# Patient Record
Sex: Female | Born: 1955 | Hispanic: Refuse to answer | Marital: Married | Smoking: Never smoker
Health system: Southern US, Community
[De-identification: ages and names within clinical notes are randomized; demographics above are authoritative.]

## PROBLEM LIST (undated history)

## (undated) DIAGNOSIS — F329 Major depressive disorder, single episode, unspecified: Secondary | ICD-10-CM

## (undated) DIAGNOSIS — I341 Nonrheumatic mitral (valve) prolapse: Secondary | ICD-10-CM

## (undated) DIAGNOSIS — Z87898 Personal history of other specified conditions: Secondary | ICD-10-CM

## (undated) DIAGNOSIS — T7840XA Allergy, unspecified, initial encounter: Secondary | ICD-10-CM

## (undated) DIAGNOSIS — M329 Systemic lupus erythematosus, unspecified: Secondary | ICD-10-CM

## (undated) DIAGNOSIS — D649 Anemia, unspecified: Secondary | ICD-10-CM

## (undated) DIAGNOSIS — F32A Depression, unspecified: Secondary | ICD-10-CM

## (undated) DIAGNOSIS — F419 Anxiety disorder, unspecified: Secondary | ICD-10-CM

## (undated) DIAGNOSIS — D219 Benign neoplasm of connective and other soft tissue, unspecified: Secondary | ICD-10-CM

## (undated) DIAGNOSIS — IMO0002 Reserved for concepts with insufficient information to code with codable children: Secondary | ICD-10-CM

## (undated) DIAGNOSIS — R002 Palpitations: Secondary | ICD-10-CM

## (undated) HISTORY — DX: Anemia, unspecified: D64.9

## (undated) HISTORY — PX: TRANSTHORACIC ECHOCARDIOGRAM: SHX275

## (undated) HISTORY — PX: WISDOM TOOTH EXTRACTION: SHX21

## (undated) HISTORY — DX: Allergy, unspecified, initial encounter: T78.40XA

## (undated) HISTORY — DX: Systemic lupus erythematosus, unspecified: M32.9

## (undated) HISTORY — DX: Anxiety disorder, unspecified: F41.9

## (undated) HISTORY — DX: Benign neoplasm of connective and other soft tissue, unspecified: D21.9

## (undated) HISTORY — PX: TOTAL ABDOMINAL HYSTERECTOMY: SHX209

## (undated) HISTORY — DX: Reserved for concepts with insufficient information to code with codable children: IMO0002

## (undated) HISTORY — PX: TONSILLECTOMY AND ADENOIDECTOMY: SUR1326

## (undated) HISTORY — DX: Palpitations: R00.2

## (undated) HISTORY — PX: MYOMECTOMY: SHX85

## (undated) HISTORY — DX: Nonrheumatic mitral (valve) prolapse: I34.1

## (undated) HISTORY — DX: Depression, unspecified: F32.A

## (undated) HISTORY — DX: Personal history of other specified conditions: Z87.898

## (undated) HISTORY — DX: Major depressive disorder, single episode, unspecified: F32.9

---

## 1998-10-11 ENCOUNTER — Encounter (INDEPENDENT_AMBULATORY_CARE_PROVIDER_SITE_OTHER): Payer: Self-pay | Admitting: Specialist

## 1998-10-11 ENCOUNTER — Ambulatory Visit (HOSPITAL_COMMUNITY): Admission: RE | Admit: 1998-10-11 | Discharge: 1998-10-11 | Payer: Self-pay | Admitting: *Deleted

## 1999-01-30 ENCOUNTER — Emergency Department (HOSPITAL_COMMUNITY): Admission: EM | Admit: 1999-01-30 | Discharge: 1999-01-30 | Payer: Self-pay | Admitting: Internal Medicine

## 1999-07-06 ENCOUNTER — Ambulatory Visit (HOSPITAL_COMMUNITY): Admission: RE | Admit: 1999-07-06 | Discharge: 1999-07-06 | Payer: Self-pay | Admitting: Surgery

## 1999-07-06 ENCOUNTER — Encounter (INDEPENDENT_AMBULATORY_CARE_PROVIDER_SITE_OTHER): Payer: Self-pay | Admitting: Specialist

## 1999-09-21 ENCOUNTER — Encounter: Payer: Self-pay | Admitting: Internal Medicine

## 1999-09-21 ENCOUNTER — Encounter: Admission: RE | Admit: 1999-09-21 | Discharge: 1999-09-21 | Payer: Self-pay | Admitting: Internal Medicine

## 1999-11-17 ENCOUNTER — Other Ambulatory Visit: Admission: RE | Admit: 1999-11-17 | Discharge: 1999-11-17 | Payer: Self-pay | Admitting: *Deleted

## 2000-09-21 ENCOUNTER — Encounter: Admission: RE | Admit: 2000-09-21 | Discharge: 2000-09-21 | Payer: Self-pay | Admitting: Internal Medicine

## 2000-09-21 ENCOUNTER — Encounter: Payer: Self-pay | Admitting: Internal Medicine

## 2000-11-30 ENCOUNTER — Other Ambulatory Visit: Admission: RE | Admit: 2000-11-30 | Discharge: 2000-11-30 | Payer: Self-pay | Admitting: *Deleted

## 2001-10-04 ENCOUNTER — Encounter: Payer: Self-pay | Admitting: Internal Medicine

## 2001-10-04 ENCOUNTER — Encounter: Admission: RE | Admit: 2001-10-04 | Discharge: 2001-10-04 | Payer: Self-pay | Admitting: Internal Medicine

## 2002-02-12 ENCOUNTER — Other Ambulatory Visit: Admission: RE | Admit: 2002-02-12 | Discharge: 2002-02-12 | Payer: Self-pay | Admitting: Obstetrics and Gynecology

## 2002-02-25 ENCOUNTER — Encounter: Payer: Self-pay | Admitting: Obstetrics and Gynecology

## 2002-02-25 ENCOUNTER — Ambulatory Visit (HOSPITAL_COMMUNITY): Admission: RE | Admit: 2002-02-25 | Discharge: 2002-02-25 | Payer: Self-pay | Admitting: Obstetrics and Gynecology

## 2002-05-16 ENCOUNTER — Encounter: Payer: Self-pay | Admitting: Obstetrics and Gynecology

## 2002-05-16 ENCOUNTER — Ambulatory Visit (HOSPITAL_COMMUNITY): Admission: RE | Admit: 2002-05-16 | Discharge: 2002-05-16 | Payer: Self-pay | Admitting: Obstetrics and Gynecology

## 2002-09-05 ENCOUNTER — Encounter (INDEPENDENT_AMBULATORY_CARE_PROVIDER_SITE_OTHER): Payer: Self-pay | Admitting: *Deleted

## 2002-09-05 ENCOUNTER — Inpatient Hospital Stay (HOSPITAL_COMMUNITY): Admission: RE | Admit: 2002-09-05 | Discharge: 2002-09-08 | Payer: Self-pay | Admitting: Obstetrics and Gynecology

## 2002-10-13 ENCOUNTER — Encounter: Payer: Self-pay | Admitting: Internal Medicine

## 2002-10-13 ENCOUNTER — Ambulatory Visit (HOSPITAL_COMMUNITY): Admission: RE | Admit: 2002-10-13 | Discharge: 2002-10-13 | Payer: Self-pay | Admitting: Internal Medicine

## 2003-09-18 ENCOUNTER — Other Ambulatory Visit: Admission: RE | Admit: 2003-09-18 | Discharge: 2003-09-18 | Payer: Self-pay | Admitting: Obstetrics and Gynecology

## 2003-10-29 ENCOUNTER — Ambulatory Visit (HOSPITAL_COMMUNITY): Admission: RE | Admit: 2003-10-29 | Discharge: 2003-10-29 | Payer: Self-pay | Admitting: Internal Medicine

## 2003-11-04 ENCOUNTER — Other Ambulatory Visit: Admission: RE | Admit: 2003-11-04 | Discharge: 2003-11-04 | Payer: Self-pay | Admitting: Obstetrics and Gynecology

## 2004-03-11 ENCOUNTER — Other Ambulatory Visit: Admission: RE | Admit: 2004-03-11 | Discharge: 2004-03-11 | Payer: Self-pay | Admitting: Obstetrics and Gynecology

## 2004-03-12 ENCOUNTER — Emergency Department (HOSPITAL_COMMUNITY): Admission: EM | Admit: 2004-03-12 | Discharge: 2004-03-12 | Payer: Self-pay | Admitting: Emergency Medicine

## 2004-07-25 ENCOUNTER — Encounter (INDEPENDENT_AMBULATORY_CARE_PROVIDER_SITE_OTHER): Payer: Self-pay | Admitting: *Deleted

## 2004-07-25 ENCOUNTER — Ambulatory Visit (HOSPITAL_BASED_OUTPATIENT_CLINIC_OR_DEPARTMENT_OTHER): Admission: RE | Admit: 2004-07-25 | Discharge: 2004-07-25 | Payer: Self-pay | Admitting: Surgery

## 2004-11-09 ENCOUNTER — Ambulatory Visit (HOSPITAL_COMMUNITY): Admission: RE | Admit: 2004-11-09 | Discharge: 2004-11-09 | Payer: Self-pay | Admitting: Obstetrics and Gynecology

## 2004-11-18 ENCOUNTER — Other Ambulatory Visit: Admission: RE | Admit: 2004-11-18 | Discharge: 2004-11-18 | Payer: Self-pay | Admitting: Obstetrics and Gynecology

## 2004-11-24 ENCOUNTER — Ambulatory Visit (HOSPITAL_COMMUNITY): Admission: RE | Admit: 2004-11-24 | Discharge: 2004-11-24 | Payer: Self-pay | Admitting: Obstetrics and Gynecology

## 2005-11-23 ENCOUNTER — Other Ambulatory Visit: Admission: RE | Admit: 2005-11-23 | Discharge: 2005-11-23 | Payer: Self-pay | Admitting: Obstetrics and Gynecology

## 2005-11-29 ENCOUNTER — Ambulatory Visit (HOSPITAL_COMMUNITY): Admission: RE | Admit: 2005-11-29 | Discharge: 2005-11-29 | Payer: Self-pay | Admitting: Obstetrics and Gynecology

## 2006-12-03 ENCOUNTER — Ambulatory Visit (HOSPITAL_COMMUNITY): Admission: RE | Admit: 2006-12-03 | Discharge: 2006-12-03 | Payer: Self-pay | Admitting: Obstetrics and Gynecology

## 2007-12-06 ENCOUNTER — Ambulatory Visit (HOSPITAL_COMMUNITY): Admission: RE | Admit: 2007-12-06 | Discharge: 2007-12-06 | Payer: Self-pay | Admitting: Obstetrics and Gynecology

## 2008-12-07 ENCOUNTER — Ambulatory Visit (HOSPITAL_COMMUNITY): Admission: RE | Admit: 2008-12-07 | Discharge: 2008-12-07 | Payer: Self-pay | Admitting: Obstetrics and Gynecology

## 2009-12-09 ENCOUNTER — Ambulatory Visit (HOSPITAL_COMMUNITY): Admission: RE | Admit: 2009-12-09 | Discharge: 2009-12-09 | Payer: Self-pay | Admitting: Obstetrics and Gynecology

## 2010-08-12 NOTE — H&P (Signed)
NAME:  Sharon Kramer, Sharon Kramer                        ACCOUNT NO.:  [id number]   MEDICAL RECORD NO.:  [id number]                   PATIENT TYPE:  INP   LOCATION:  NA                                   [facility]:  [facility]   PHYSICIAN:  Dois Davenport A. Rivard, M.D.              DATE OF BIRTH:  1955-06-15   DATE OF ADMISSION:  09/05/2002  DATE OF DISCHARGE:                                HISTORY & PHYSICAL   REASON FOR ADMISSION:  Uterine fibroids.   HISTORY OF PRESENT ILLNESS:  This is a 55 year old married African-American  female gravida 2, para 2, live three children who is being admitted to  undergo total abdominal hysterectomy for large uterine fibroids.  The  patient had been followed regularly by her previous gynecologist and has  been known to have uterine fibroids for many years.  She has had a previous  myomectomy in 1987 and multiple ultrasound studies.  She came to me on  April 30, 2002 for a second opinion on treatment options.  She reports for  the last year and a half irregular cycles every 23-60 days, four days of  heavy flow with no metrorrhagia, no severe dysmenorrhea or dyspareunia.  She  denies any postcoital bleeding.  She has had an endometrial biopsy on  March 25, 2002 which was normal as well as multiple imaging studies.   Her most recent pelvic MRI was on May 16, 2002 and revealed a very  large lobulated pelvic mass arising from the posterior uterine myometrium  measuring 15 x 11.4 x 13.6 cm.  The cranial extent is just above the level  of the umbilicus.  This mass displaces the uterus caudally in the pelvis.  Those features are most compatible with a large subserosal fibroid.  A  second lobulated lesion which arises from the right cranial aspect of the  large mass and measures 6.6 x 5.1 cm is also compatible with a large  fibroid.  Several other smaller fibroids are identified throughout the body  of the uterus including a 5.5 cm intramural left fibroid.  Right ovary  is  identified along the right pelvic side wall and is normal.  The left ovary  is positioned inferiorly in the anatomic pelvis and also appears normal.  No  lymphadenopathy is identified.  Endometrial biopsy on March 25, 2002  revealed superficial endometrium with no evidence of malignancy.  Pap smear  in November 2003 was within normal limits.  Last mammogram June 2002 was  normal.   The patient has received Depo-Lupron 3.75 mg on June 05, 2002 as well as  July 03, 2002 and had complained of significant frequent hot flashes as well  as amenorrhea.  She is going to receive her third Depo-Lupron next week.  She had also made arrangements with [facility] for autotransfusion and  will be giving 1 unit on May 13 as well as 1 unit on May  20 in preparation  for surgery.  She otherwise denies any pelvic pain.   REVIEW OF SYSTEMS:  CONSTITUTIONAL:  Normal.  HEENT:  Negative.  CARDIOVASCULAR:  Normal.  GASTROINTESTINAL:  Normal.  URINARY:  Normal.  PSYCHIATRIC:  Normal.   PAST MEDICAL HISTORY:  1. Spontaneous vaginal delivery in 1980 of a female and 53 of twin males.  2. Status post tonsillectomy.  3. Status post myomectomy 1987.  4. Hypertension.  5. Mitral valve prolapse requiring premedication.  6. Anxiety.  7. Previous history of anemia with a decrease in hemoglobin at 9.7.     Currently, hemoglobin at 12.1.  8. Recent colonoscopy in April 2004 by Tasia Catchings, M.D. within normal     limits.   ALLERGIES:  PENICILLIN.   CURRENT MEDICATIONS:  1. Atenolol 50 mg p.o. daily.  2. Zoloft 50 mg p.o. daily.   SOCIAL HISTORY:  Married.  Nonsmoker.  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   FAMILY HISTORY:  Father deceased at age 35 of colon cancer.  Mother is 64  years old.  Has glaucoma and thyroid issues.  Sister is 47 year old, alive  and well.  No history of feminine cancer.   PHYSICAL EXAMINATION:  VITAL SIGNS:  Current weight 142 pounds, height 5   feet 5 inches, blood pressure 140/80.  HEENT:  Normal.  NECK:  Thyroid not enlarged.  HEART:  Mild systolic murmur 1-2/6.  CHEST:  Clear.  BREASTS:  Normal.  BACK:  No CVA tenderness.  ABDOMEN:  No hepatosplenomegaly.  Uterus palpated 2 cm below the umbilicus  approximately at 18 weeks size, nonmobile, filling the entire pelvic cavity.  EXTREMITIES:  Negative.  NEUROLOGIC:  Normal.  PELVIC:  Normal external genitalia.  Normal vagina.  Normal cervix.  Uterus,  again, filling the entire pelvic cavity, immobile, nontender, approximately  18 weeks in size.  Adnexa not felt.   ASSESSMENT:  Large uterine fibroids in asymptomatic perimenopausal patient  who desires surgical treatment.   Total abdominal hysterectomy is scheduled for September 05, 2002.  Procedure has  been fully reviewed with the patient including possible complications such  as bleeding, infection, injury to bowel, bladder, or ureters, need for  midline incision, hospital stay and recovery.  The patient wishes to  preserve her ovaries.  We will plan antibiotic prophylaxis for mitral valve  prolapse as well as a pelvic examination under general anesthesia to  determine incision type.                                               Crist Fat Rivard, M.D.    SAR/MEDQ  D:  07/31/2002  T:  07/31/2002  Job:  [id number]

## 2010-08-12 NOTE — Discharge Summary (Signed)
   NAME:  Sharon Kramer, Sharon Kramer                        ACCOUNT NO.:  [id number]   MEDICAL RECORD NO.:  [id number]                   PATIENT TYPE:  INP   LOCATION:  9310                                 [facility]ACILITY:  [facility]   PHYSICIAN:  Crist Fat. Rivard, M.D.              DATE OF BIRTH:  1955/10/09   DATE OF ADMISSION:  09/05/2002  DATE OF DISCHARGE:  09/08/2002                                 DISCHARGE SUMMARY   REASON FOR ADMISSION:  Uterine fibroids.   HOSPITAL COURSE:  This is a 55 year old married African American female who  was being admitted for definitive treatment of uterine fibroids.  She  underwent a total abdominal hysterectomy with preservation of her ovaries  per her request on September 05, 2002, without any complications.  Her estimated  blood loss during surgery was 350 mL.   Final pathology report is pending.   Postoperative course was unremarkable.  On postoperative day #1, the  patient's hemoglobin was 9.7 for which the patient was given back her two  units of autotransfusion.   On postoperative day #2, she returned to normal diet and was working on  adequate pain management.   On postoperative day #3, she was doing very well, tolerating her diet and  ambulating. The pain was well managed. She was voiding and passing gas  without difficulty.  She was deemed to have received the full benefit of her  hospital course.   CONDITION ON DISCHARGE:  Well and stable.   DISCHARGE INSTRUCTIONS:  The patient will be seen at [facility] in six  weeks for her postoperative evaluation.  She is given a prescription of  Motrin 600 mg p.o. q.i.d. 60 tablets with one refill, as well as Dilaudid 2  mg q.3-4h. p.r.n. 40 tablets with no refills for pain management.  She was  also given instructions for a prompt recovery and she is instructed to call  if experiencing fever, increased abdominal pain or increased vaginal  bleeding.  Her staples are removed on the day of her surgery.                                             Crist Fat Rivard, M.D.    SAR/MEDQ  D:  09/08/2002  T:  09/08/2002  Job:  [id number]

## 2010-08-12 NOTE — Op Note (Signed)
NAME:  Sharon Kramer, WERTENBERGER                        ACCOUNT NO.:  [id number]   MEDICAL RECORD NO.:  [id number]                   PATIENT TYPE:  INP   LOCATION:  9310                                 [facility]ILITY[facility]   PHYSICIAN:  Crist Fat. Rivard, M.D.              DATE OF BIRTH:  01-14-1956   DATE OF PROCEDURE:  09/05/2002  DATE OF DISCHARGE:                                 OPERATIVE REPORT   PREOPERATIVE DIAGNOSIS:  Uterine fibroids.   POSTOPERATIVE DIAGNOSIS:  Uterine fibroids.   ANESTHESIA:  Spinal.   PROCEDURE:  Total abdominal hysterectomy with Moschowitz procedure.   SURGEON:  Crist Fat. Rivard, M.D.   ASSISTANT:  Naima A. Dillard, M.D.   ESTIMATED BLOOD LOSS:  350 mL.   PROCEDURE:  After being performed of the planned procedure with possible  complications including bleeding, infection, injury to bowels, bladder, or  ureters, need for postoperative transfusion for which the patient has  prepared with autotransfusion, informed consent was obtained.  The patient  was  taken to OR #3, given spinal anesthesia, and placed in the dorsal  decubitus position.  She was prepped and draped in a sterile fashion and a  Foley catheter was inserted into her bladder.  She has received Clindamycin  600 mg IV upon arrival to the operating room.  After assessing adequate  level of anesthesia, the incision site from umbilicus to pubic bone was  infiltrated with 20 mL of Marcaine 0.25%, and we proceeded with a midline  incision to the fascia.  The fascia was incised in the midline way, linea  alba was identified, and peritoneum was entered in a midline fashion.  Observation:  We have a large uterine fibroid completely filling the pelvic-  abdominal cavity up to 2 cm above the umbilicus.  The omentum is slightly  adherent to the anterior surface.  The posterior surface is free of  adhesions.  Both ovaries appear normal.  Both tubes appear normal even  though the left tube is greatly displaced on  the top of the fibroid, which  is arising from the fundal area of the uterus.  The rest of the evaluation  is limited by the volume of the mass.  We proceed with freeing the omental  adhesion, isolating it on two Taylor clamps, sectioning and suturing with 0  Vicryl.  This now allows [place] to exteriorize the whole mass with the uterus  very easily.  We are able to now identify the round ligaments, suture them,  and section them.  This allows [place] to open the broad ligament and to bluntly  and sharply dissect bladder downward.  The utero-ovarian ligament on each  side is then isolated, clamped with Rogers clamps, sectioned, and sutured  with a transfix suture of 0 Vicryl.  We are now able to skeletonize uterine  vessels, have direct visualization of the ureters, clamp the uterine vessels  on both sides with  Rogers clamps, section and suture them with a double  suture of 0 Vicryl.  The cardinal ligaments are then partly isolated with  Rogers clamped, sectioned, and sutured with a transfix suture of 0 Vicryl,  and the body of the uterus is then sectioned away from the cervix using  knife.  We are now able to safely retract the bowels with abdominal sponges  and place an O'Connor self-retaining retractor.  We can now proceed with the  removal of the cervix, and we can isolate the cardinal ligament on each side  with Rogers clamps, section and suture them with a transfix suture of 0  Vicryl.  We are able to identify uterosacral ligaments, which are of very  good quality, and we isolate them with the Rogers clamp, section and suture  them with a transfix suture of 0 Vicryl, and those are kept for future  suspension.  In the process of isolating those ligaments, of course were  able to keep a direct visualization on both ureters.  The vaginal angles are  now isolated with Rogers clamp, sectioned and sutured with a transfix suture  of 0 Vicryl, which is also sutured to the corresponding uterosacral   ligament.  The vaginal vault is then incised, the cervix is removed  entirely, and the vault is closed with a figure-of-eight stitch of 0 Vicryl.  We then irrigate profusely with warm saline and complete the hemostasis  under the bladder flap with cautery.  We also complete the hemostasis on the  peritoneal edges around the left adnexa with a running locked suture of 2-0  Vicryl.  Because of the depth of the posterior cul-de-sac and the very good  quality of the uterosacral ligament, decision is made to proceed with  Moschowitz to prevent future enterocele.  Again we are able to maintain both  ureters under direct visualization and using four sutures of Ethibond 0, we  are able to completely occlude the posterior cul-de-sac, joining both  uterosacral ligaments as well as the serosal surface of the sigmoid colon.  This produces a very satisfactory Moschowitz procedure.  Again both ureters  are assessed normal away from our suture line with good peristalsis.  We  irrigate profusely with warm saline, note a satisfactory hemostasis, remove  all laps, and proceed with closure of the incision, closing the fascia with  two running sutures of 0 Vicryl, meeting midline.  Under fascia hemostasis  was completed previously with cautery.  The incision was then irrigated with  warm saline profusely, hemostasis is completed with cautery, and the skin is  closed with staples as well as Steri-Strips.   Instrument and sponge count is complete x2.  Estimated blood loss is 350 mL.  The procedure is very well-tolerated by the patient, who is taken to the  recovery room in a well and stable condition.                                               Crist Fat Rivard, M.D.    SAR/MEDQ  D:  09/05/2002  T:  09/05/2002  Job:  [id number]

## 2010-08-12 NOTE — Op Note (Signed)
Sharon Kramer, Sharon Kramer              ACCOUNT NO.:  [id number]   MEDICAL RECORD NO.:  [id number]          PATIENT TYPE:  AMB   LOCATION:  DSC                          FACILITY:  [facility]   PHYSICIAN:  Thornton Park. Daphine Deutscher, MD  DATE OF BIRTH:  1955/05/18   DATE OF PROCEDURE:  07/25/2004  DATE OF DISCHARGE:                                 OPERATIVE REPORT   PREOPERATIVE DIAGNOSIS:  Bilateral lipomas of the foot.   POSTOPERATIVE DIAGNOSIS:  Bilateral lipomas of the foot.   OPERATION PERFORMED:  Biopsy of three lipomas, two on the right and one on  the left.   SURGEON:  Thornton Park. Daphine Deutscher, MD   ANESTHESIA:  Local in the minor room.   INDICATIONS FOR PROCEDURE:  [name] is a 55 year old lady with these soft  masses that have grown anterior to her lateral malleolus bilaterally. On the  right side she has recently noticed about a 1 cm and one a little bit lower.  She had an MR of her ankle by Dr. Amanda Pea which I reviewed the report which  showed these appeared to be lipomatous masses.  She was very concerned and  wanted to get these removed.  Informed consent was obtained regarding  recurrence, bleeding, infection and we are going to try to get these through  smaller incisions if possible.   DESCRIPTION OF PROCEDURE:  The patient was in the minor room.  The areas  were prepped with Betadine and draped sterilely.  I infiltrated with  lidocaine and made a small transverse incision and the smaller one shelled  out and what appeared to be a sclerotic lipoma.  It was closed with  interrupted simple 4-0 Prolene.  Likewise, on the right side above, the  larger one was achieved through about a 2 cm incision shelling this out and  debulking it.  It too was closed with interrupted 4-0 Prolenes.  Likewise on  the left foot, a similar incision was made on the malleolar lesion which was  then excised and closed with 4-0 Prolenes.  Neosporin was applied. The  patient will come back in 10  days for suture  removal.  She was given Vicodin to take for pain.  These  lesions were sent separately for pathologic examination.   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      MBM/MEDQ  D:  07/25/2004  T:  07/25/2004  Job:  [id number]   cc:   Lilla Shook, M.D.  [address], [address]  [place]  [place] [address]  Fax: [phone]   Dionne Ano. Everlene Other, M.D.  [address]  [place]  [place] [address]  Fax: [phone]

## 2010-11-02 ENCOUNTER — Other Ambulatory Visit (HOSPITAL_COMMUNITY): Payer: Self-pay | Admitting: Obstetrics and Gynecology

## 2010-11-02 DIAGNOSIS — Z1231 Encounter for screening mammogram for malignant neoplasm of breast: Secondary | ICD-10-CM

## 2010-12-13 ENCOUNTER — Ambulatory Visit (HOSPITAL_COMMUNITY)
Admission: RE | Admit: 2010-12-13 | Discharge: 2010-12-13 | Disposition: A | Discharge status: Home / Self Care | Payer: BC Managed Care – PPO | Source: Ambulatory Visit | Attending: Obstetrics and Gynecology | Admitting: Obstetrics and Gynecology

## 2010-12-13 DIAGNOSIS — Z1231 Encounter for screening mammogram for malignant neoplasm of breast: Secondary | ICD-10-CM

## 2011-02-27 DIAGNOSIS — F411 Generalized anxiety disorder: Secondary | ICD-10-CM | POA: Insufficient documentation

## 2011-02-27 DIAGNOSIS — J309 Allergic rhinitis, unspecified: Secondary | ICD-10-CM | POA: Insufficient documentation

## 2011-02-27 DIAGNOSIS — Z91010 Allergy to peanuts: Secondary | ICD-10-CM | POA: Insufficient documentation

## 2011-02-27 DIAGNOSIS — F32A Depression, unspecified: Secondary | ICD-10-CM | POA: Insufficient documentation

## 2011-02-27 DIAGNOSIS — D649 Anemia, unspecified: Secondary | ICD-10-CM | POA: Insufficient documentation

## 2011-02-27 DIAGNOSIS — I8390 Asymptomatic varicose veins of unspecified lower extremity: Secondary | ICD-10-CM | POA: Insufficient documentation

## 2011-11-07 ENCOUNTER — Other Ambulatory Visit: Payer: Self-pay | Admitting: Obstetrics and Gynecology

## 2011-11-07 DIAGNOSIS — Z1231 Encounter for screening mammogram for malignant neoplasm of breast: Secondary | ICD-10-CM

## 2011-12-13 ENCOUNTER — Telehealth: Payer: Self-pay

## 2011-12-13 NOTE — Telephone Encounter (Signed)
Pt requesting Diflucan for yeast infection due to course of ABX.  Will consult SR for request.  ld

## 2011-12-13 NOTE — Telephone Encounter (Signed)
OK to call in Diflucan 150 mg #1  No refills

## 2011-12-14 ENCOUNTER — Ambulatory Visit (HOSPITAL_COMMUNITY)
Admission: RE | Admit: 2011-12-14 | Discharge: 2011-12-14 | Disposition: A | Discharge status: Home / Self Care | Payer: BC Managed Care – PPO | Source: Ambulatory Visit | Attending: Obstetrics and Gynecology | Admitting: Obstetrics and Gynecology

## 2011-12-14 DIAGNOSIS — Z1231 Encounter for screening mammogram for malignant neoplasm of breast: Secondary | ICD-10-CM | POA: Insufficient documentation

## 2011-12-14 MED ORDER — FLUCONAZOLE 150 MG PO TABS: 150.0000 mg | ORAL_TABLET | Freq: Once | ORAL | Status: DC

## 2011-12-14 NOTE — Telephone Encounter (Signed)
Ok for Diflucan 150mg  #1 no refills per SR.  Pt notified.  ld

## 2011-12-18 NOTE — Progress Notes (Signed)
Quick Note:  Please send "Dense breast" letter to patient and document in chart when letter is sent. ______ 

## 2011-12-19 ENCOUNTER — Encounter: Payer: Self-pay | Admitting: Obstetrics and Gynecology

## 2012-01-19 ENCOUNTER — Other Ambulatory Visit: Payer: Self-pay | Admitting: Obstetrics and Gynecology

## 2012-01-19 ENCOUNTER — Ambulatory Visit (INDEPENDENT_AMBULATORY_CARE_PROVIDER_SITE_OTHER): Payer: BC Managed Care – PPO | Admitting: Obstetrics and Gynecology

## 2012-01-19 ENCOUNTER — Encounter: Payer: Self-pay | Admitting: Obstetrics and Gynecology

## 2012-01-19 VITALS — BP 112/78 | Ht 66.0 in | Wt 160.0 lb

## 2012-01-19 DIAGNOSIS — Z01419 Encounter for gynecological examination (general) (routine) without abnormal findings: Secondary | ICD-10-CM

## 2012-01-19 MED ORDER — ESTRADIOL ACETATE 0.1 MG/24HR VA RING: 0.0500 | VAGINAL_RING | VAGINAL | Status: DC

## 2012-01-19 MED ORDER — NYSTATIN-TRIAMCINOLONE 100000-0.1 UNIT/GM-% EX OINT: TOPICAL_OINTMENT | Freq: Three times a day (TID) | CUTANEOUS | Status: DC | PRN

## 2012-01-19 NOTE — Progress Notes (Signed)
The patient is taking hormone replacement therapy The patient  is not taking a Calcium supplement. Post-menopausal bleeding:no Hyst   Last Pap: was normal December  2011 Last mammogram: was normal November  2013 Last DEXA scan : T= 0.28 December 2010 Last colonoscopy:normal 2009-2010 Urinary symptoms: none Normal bowel movements: Yes Reports abuse at home: No:  Subjective:    Sharon Kramer is a 56 y.o. female G3P3 who presents for annual exam.  The patient has no complaints today.   The following portions of the patient's history were reviewed and updated as appropriate: allergies, current medications, past family history, past medical history, past social history, past surgical history and problem list.  Review of Systems Pertinent items are noted in HPI. Gastrointestinal:No change in bowel habits, no abdominal pain, no rectal bleeding Genitourinary:negative for dysuria, frequency, hematuria, nocturia and urinary incontinence    Objective:     BP 112/78  Ht 5\' 6"  (1.676 m)  Wt 160 lb (72.576 kg)  BMI 25.82 kg/m2  Weight:  Wt Readings from Last 1 Encounters:  01/19/12 160 lb (72.576 kg)     BMI: Body mass index is 25.82 kg/(m^2). General Appearance: Alert, appropriate appearance for age. No acute distress HEENT: Grossly normal Neck / Thyroid: Supple, no masses, nodes or enlargement Lungs: clear to auscultation bilaterally Back: No CVA tenderness Breast Exam: No masses or nodes.No dimpling, nipple retraction or discharge. Cardiovascular: Regular rate and rhythm. S1, S2, no murmur Gastrointestinal: Soft, non-tender, no masses or organomegaly Pelvic Exam: Vulva and vagina appear normal. Bimanual exam reveals normal  Adnexa. Uterus surgically absent Rectovaginal: normal rectal, no masses Lymphatic Exam: Non-palpable nodes in neck, clavicular, axillary, or inguinal regions Skin: no rash or abnormalities Neurologic: Normal gait and speech, no tremor  Psychiatric: Alert and  oriented, appropriate affect.     Assessment:    Normal gyn exam    Plan:   mammogram return annually or prn Follow-up:  for annual exam Femring 0.05 ( reducing dosage per pt request) Mycolog II   Silverio Lay MD

## 2012-06-01 ENCOUNTER — Encounter (HOSPITAL_BASED_OUTPATIENT_CLINIC_OR_DEPARTMENT_OTHER): Payer: Self-pay | Admitting: Emergency Medicine

## 2012-06-01 ENCOUNTER — Emergency Department (HOSPITAL_BASED_OUTPATIENT_CLINIC_OR_DEPARTMENT_OTHER)
Admission: EM | Admit: 2012-06-01 | Discharge: 2012-06-01 | Disposition: A | Discharge status: Home / Self Care | Payer: BC Managed Care – PPO | Attending: Emergency Medicine | Admitting: Emergency Medicine

## 2012-06-01 DIAGNOSIS — F3289 Other specified depressive episodes: Secondary | ICD-10-CM | POA: Insufficient documentation

## 2012-06-01 DIAGNOSIS — Z8679 Personal history of other diseases of the circulatory system: Secondary | ICD-10-CM | POA: Insufficient documentation

## 2012-06-01 DIAGNOSIS — Z79899 Other long term (current) drug therapy: Secondary | ICD-10-CM | POA: Insufficient documentation

## 2012-06-01 DIAGNOSIS — R002 Palpitations: Secondary | ICD-10-CM | POA: Insufficient documentation

## 2012-06-01 DIAGNOSIS — F329 Major depressive disorder, single episode, unspecified: Secondary | ICD-10-CM | POA: Insufficient documentation

## 2012-06-01 DIAGNOSIS — Z8742 Personal history of other diseases of the female genital tract: Secondary | ICD-10-CM | POA: Insufficient documentation

## 2012-06-01 DIAGNOSIS — L0202 Furuncle of face: Secondary | ICD-10-CM

## 2012-06-01 DIAGNOSIS — Z8739 Personal history of other diseases of the musculoskeletal system and connective tissue: Secondary | ICD-10-CM | POA: Insufficient documentation

## 2012-06-01 MED ORDER — CLINDAMYCIN HCL 300 MG PO CAPS: 300.0000 mg | ORAL_CAPSULE | Freq: Three times a day (TID) | ORAL | Status: DC

## 2012-06-01 MED ORDER — HYDROCODONE-ACETAMINOPHEN 5-325 MG PO TABS: 1.0000 | ORAL_TABLET | Freq: Four times a day (QID) | ORAL | Status: DC | PRN

## 2012-06-01 MED ORDER — FLUCONAZOLE 100 MG PO TABS
200.0000 mg | ORAL_TABLET | Freq: Once | ORAL | Status: AC
  Administered 2012-06-01: 200 mg via ORAL

## 2012-06-01 MED ORDER — FLUCONAZOLE 100 MG PO TABS
ORAL_TABLET | ORAL | Status: AC
  Filled 2012-06-01: qty 2

## 2012-06-01 MED ORDER — HYDROCODONE-ACETAMINOPHEN 5-325 MG PO TABS
1.0000 | ORAL_TABLET | Freq: Once | ORAL | Status: AC
  Administered 2012-06-01: 1 via ORAL
  Filled 2012-06-01: qty 1

## 2012-06-01 MED ORDER — DEXAMETHASONE SODIUM PHOSPHATE 10 MG/ML IJ SOLN
8.0000 mg | Freq: Once | INTRAMUSCULAR | Status: AC
  Administered 2012-06-01: 8 mg via INTRAMUSCULAR
  Filled 2012-06-01: qty 1

## 2012-06-01 NOTE — ED Notes (Signed)
Pt has possible abscess to left side of her nose.  Pt denies fever.  No drainage.

## 2012-06-01 NOTE — ED Provider Notes (Signed)
History     CSN: [id number]  Arrival date & time 06/01/12  1204   First MD Initiated Contact with Patient 06/01/12 1239      Chief Complaint  Patient presents with  . Cellulitis    (Consider location/radiation/quality/duration/timing/severity/associated sxs/prior treatment) HPI Patient presents emergency Department with a small bump on her face just lateral to her nose on the left side.  Patient, states she's had 2 similar episodes in the past.  She sees a dermatologist for these.  She states at one point they had to be lanced, but were much larger and had fluid underneath the skin.  Patient, states, that this began 3 days, ago.  She had an appointment with her dermatologist who is going to see her but due to inclement weather did not see her.  Patient denies fever, nausea, vomiting, eye pain, eye discharge, eye redness, shortness of breath, neck swelling, dizziness, or syncope.  Patient, states she tried Tylenol and Motrin for the discomfort with minimal relief.  Patient, states she's also been using warm compresses on the area.  Patient, states she does not want take penicillin, that she is allergic to them or anything related the patient, states, that she normally takes clindamycin and her dermatologist gives her a shot of cortisone.  The patient, states that's what she would like here today. Past Medical History  Diagnosis Date  . Allergy   . Fibroids   . Depression   . Heart palpitations   . Lupus     skin   . Mitral valve regurgitation   . Tricuspid regurgitation     Past Surgical History  Procedure Laterality Date  . Myomectomy    . Tonsillectomy and adenoidectomy    . Total abdominal hysterectomy    . Wisdom tooth extraction      Family History  Problem Relation Age of Onset  . Cancer Mother     found out mom didnt have breast cancer after the removal of both  breast  . Colon cancer Father 26    History  Substance Use Topics  . Smoking status: Never Smoker   .  Smokeless tobacco: Never Used  . Alcohol Use: No    OB History   Grav Para Term Preterm Abortions TAB SAB Ect Mult Living   3 3        3       Review of Systems All other systems negative except as documented in the HPI. All pertinent positives and negatives as reviewed in the HPI. Allergies  Peanut-containing drug products and Penicillins  Home Medications   Current Outpatient Rx  Name  Route  Sig  Dispense  Refill  . Azelastine-Fluticasone (DYMISTA) 137-50 MCG/ACT SUSP   Nasal   Place into the nose.         Marland Kitchen EPINEPHrine (EPIPEN JR) 0.15 MG/0.3ML injection   Intramuscular   Inject 0.15 mg into the muscle as needed.         Marland Kitchen escitalopram (LEXAPRO) 10 MG tablet   Oral   Take 10 mg by mouth daily.         . Estradiol Acetate (FEMRING) 0.1 MG/24HR RING   Vaginal   Place 0.05 each vaginally every 3 (three) months.   0.9 each   4   . loratadine (CLARITIN) 10 MG tablet   Oral   Take 10 mg by mouth daily.         . Multiple Vitamin (MULTIVITAMIN) tablet   Oral   Take 1  tablet by mouth daily.           BP 132/81  Pulse 72  Temp(Src) 97.5 F (36.4 C) (Oral)  Resp 73  Ht 5\' 6"  (1.676 m)  Wt 135 lb (61.236 kg)  BMI 21.8 kg/m2  SpO2 100%  Physical Exam  Constitutional: She is oriented to person, place, and time. She appears well-developed and well-nourished.  HENT:  Head: Normocephalic and atraumatic.    Eyes: Conjunctivae are normal. Pupils are equal, round, and reactive to light.  There is no signs of periorbital or orbital cellulitis on exam  Neck: Normal range of motion. Neck supple.  Pulmonary/Chest: Effort normal. No stridor.  Lymphadenopathy:    She has no cervical adenopathy.  Neurological: She is alert and oriented to person, place, and time.  Skin: Skin is warm and dry. No rash noted. No erythema.    ED Course  Procedures (including critical care time) At this point the patient does not have a drainable abscess.  The areas very small  on her face and there is a small amount of surrounding swelling.  Patient is advised to use warm compresses on the area.  The patient is advised return here for any worsening in her condition and she does have followup with her dermatologist on Monday.  The patient is adamant about receiving a shot of steroid.  I did advise the patient of the risks of taking a set of steroid with possible infection.  She said that her dermatologist would do this, and that's what she wants done.   MDM          Carlyle Dolly, PA-C 06/01/12 1254

## 2012-06-02 NOTE — ED Provider Notes (Signed)
Medical screening examination/treatment/procedure(s) were performed by non-physician practitioner and as supervising physician I was immediately available for consultation/collaboration.  [name], MD 06/02/12 1514 

## 2012-11-11 ENCOUNTER — Other Ambulatory Visit: Payer: Self-pay | Admitting: Obstetrics and Gynecology

## 2012-11-11 DIAGNOSIS — Z1231 Encounter for screening mammogram for malignant neoplasm of breast: Secondary | ICD-10-CM

## 2012-12-18 ENCOUNTER — Ambulatory Visit (HOSPITAL_COMMUNITY)
Admission: RE | Admit: 2012-12-18 | Discharge: 2012-12-18 | Disposition: A | Discharge status: Home / Self Care | Payer: BC Managed Care – PPO | Source: Ambulatory Visit | Attending: Obstetrics and Gynecology | Admitting: Obstetrics and Gynecology

## 2012-12-18 ENCOUNTER — Other Ambulatory Visit: Payer: Self-pay | Admitting: Obstetrics and Gynecology

## 2012-12-18 DIAGNOSIS — Z1231 Encounter for screening mammogram for malignant neoplasm of breast: Secondary | ICD-10-CM | POA: Insufficient documentation

## 2013-01-28 ENCOUNTER — Ambulatory Visit (INDEPENDENT_AMBULATORY_CARE_PROVIDER_SITE_OTHER): Payer: BC Managed Care – PPO | Admitting: Cardiology

## 2013-01-28 ENCOUNTER — Encounter: Payer: Self-pay | Admitting: Cardiology

## 2013-01-28 VITALS — BP 120/62 | HR 65 | Ht 66.0 in | Wt 167.5 lb

## 2013-01-28 DIAGNOSIS — R002 Palpitations: Secondary | ICD-10-CM

## 2013-01-28 DIAGNOSIS — I059 Rheumatic mitral valve disease, unspecified: Secondary | ICD-10-CM

## 2013-01-28 DIAGNOSIS — I341 Nonrheumatic mitral (valve) prolapse: Secondary | ICD-10-CM | POA: Insufficient documentation

## 2013-01-28 NOTE — Assessment & Plan Note (Addendum)
Stable.  Not overly concerning & generally "intolerant" of BB.  May need PRN BB or CCB if she is put on "pulse dose steroids".

## 2013-01-28 NOTE — Patient Instructions (Signed)
You seem to be doing well.  Palpitations seem stable.  As long as they are isolated (1-2 at a time) & get better with exercise.  If you end up having to take Prednisone for Lupus - we can try a short term of either metoprolol or diltiazem to help with the increased frequency of palpitations.  You are due for an Echocardiogram to assess your Mitral Valve.  As long as the echo is stable, I will just see you back in 1 yr.  Marykay Lex, MD

## 2013-01-28 NOTE — Progress Notes (Signed)
PATIENT: Sharon Kramer MRN: [id number]  DOB: Aug 21, 1955   DOV:01/28/2013 PCP: Kari Baars, MD  Clinic Note: Chief Complaint  Patient presents with  . Annual Exam    chest pressure when she was using predinsone ,stop and pressures is gone,no sob ,no edema   HPI: Sharon Kramer is a 57 y.o. female with a PMH below who presents today for annual followup. She is a very pleasant woman who I took over from Dr. Karsten Fells.  She has a history of syncope as well as panic/anxiety attacks. This was evaluated with an echocardiogram that revealed moderate regurgitation and mild mitral valve prolapse. She has palpitations that are relatively frequent. She did not tolerate beta blocker because it made her feel foggy and fatigued.  Interval History: She presents today much stable. She says that she just understands the palpitations are noted a portable if in a relatively normal. She is undergoing evaluation for possible lupus versus fibromyalgia with a current flare of symptoms. She was treated shortly with the prednisone pulse that really made palpitations worse, so the prednisone was stopped. The palpitations were so strong and an forceful that she notices is a pressure in her chest. Since stopping the prednisone she's not had any further of this severe symptoms. A window she has palpitations she denies any rapid heartbeat lasting more than a few minutes. No lightheadedness, dizziness, wooziness or syncope/near syncope no TIA or amaurosis fugax symptoms.  The remainder of Cardiovascular ROS: no chest pain or dyspnea on exertion negative for - chest pain, dyspnea on exertion, edema, loss of consciousness, orthopnea, paroxysmal nocturnal dyspnea or shortness of breath: Additional cardiac review of systems:  Melena - no, hematochezia no; hematuria - no; nosebleeds - no; claudication - no  Past Medical History  Diagnosis Date  . Allergy   . Fibroids   . Depression   . Heart palpitations   . Lupus    skin   . Mitral valvular prolapse     Mild with mild MR  . H/O syncope   . Anxiety disorder     Prior Cardiac Evaluation and Past Surgical History: Past Surgical History  Procedure Laterality Date  . Myomectomy    . Tonsillectomy and adenoidectomy    . Total abdominal hysterectomy    . Wisdom tooth extraction    . Transthoracic echocardiogram      Normal LV Function; Mild MV prolapse with mild MR.    Allergies  Allergen Reactions  . Peanut-Containing Drug Products   . Penicillins     Current Outpatient Prescriptions  Medication Sig Dispense Refill  . EPINEPHrine (EPIPEN JR) 0.15 MG/0.3ML injection Inject 0.15 mg into the muscle as needed.      Marland Kitchen escitalopram (LEXAPRO) 20 MG tablet Take 20 mg by mouth daily.      Marland Kitchen estradiol (ESTRACE) 0.5 MG tablet 0.5 mg. Take 2 tablet m,fri,take1 tablet tues,wed,fri,sat ,sun      . loratadine (CLARITIN) 10 MG tablet Take 10 mg by mouth as needed.       Marland Kitchen LORazepam (ATIVAN) 0.5 MG tablet Take 0.5 mg by mouth 2 (two) times daily as needed for anxiety.      . Multiple Vitamin (MULTIVITAMIN) tablet Take 1 tablet by mouth daily.      Marland Kitchen OVER THE COUNTER MEDICATION Allergy injection 3 x week      . traMADol (ULTRAM) 50 MG tablet Take 50 mg by mouth 2 (two) times daily.       No current facility-administered  medications for this visit.    History   Social History Narrative   Married, mother of 3.   No Smoking; No EtoH   Exercises - ~4 d/ week.    ROS: A comprehensive Review of Systems - Negative except Mild diffuse symptoms that would be residual from what was thought to be possible lupus flare versus fibromyalgia.  PHYSICAL EXAM BP 120/62  Pulse 65  Ht 5\' 6"  (1.676 m)  Wt 167 lb 8 oz (75.978 kg)  BMI 27.05 kg/m2 General appearance: alert, cooperative, appears stated age, no distress and Well appearing, well groomed. Answers questions appropriately. Neck: no adenopathy, no carotid bruit, no JVD and supple, symmetrical, trachea  midline Lungs: clear to auscultation bilaterally, normal percussion bilaterally and Nonlabored, good movement. Heart: regular rate and rhythm, S1, S2 normal, no murmur, click, rub or gallop, normal apical impulse and I do not hear any significant systolic click, there is a possible split S1 which may be due to subtle split. No significant murmur heard today. Abdomen: soft, non-tender; bowel sounds normal; no masses,  no organomegaly Extremities: extremities normal, atraumatic, no cyanosis or edema Pulses: 2+ and symmetric Neurologic: Alert and oriented X 3, normal strength and tone. Normal symmetric reflexes. Normal coordination and gait  EAV:[id number] today: Yes Rate: 65, Rhythm: NSR, normal ECG;    Recent Labs: None  ASSESSMENT / PLAN: Mitral valvular prolapse Murmur is barely audible & palpitations are stable.  Due for routine f/u Echocardiogram to reassess status of MVP & MR/TR.  Plan: 2 D Echocardiogram  Heart palpitations Stable.  Not overly concerning & generally "intolerant" of BB.  May need PRN BB or CCB if she is put on "pulse dose steroids".   Orders Placed This Encounter  Procedures  . EKG 12-Lead  . 2D Echocardiogram without contrast    MItral Valve prolapse with MR & TR    Standing Status: Future     Number of Occurrences:      Standing Expiration Date: 01/28/2014    Order Specific Question:  Type of Echo    Answer:  Complete    Order Specific Question:  Where should this test be performed    Answer:  [facility]    Order Specific Question:  Reason for exam-Echo    Answer:  Mitral Valve Disorder  424.0    Followup: 1 yr unless Echo is Grossly abnormal.  DAVID W. [name], M.D., M.S. [facility] [address] [place], [place]  [address]  [phone] Pager # [phone]

## 2013-01-28 NOTE — Assessment & Plan Note (Signed)
Murmur is barely audible & palpitations are stable.  Due for routine f/u Echocardiogram to reassess status of MVP & MR/TR.  Plan: 2 D Echocardiogram

## 2013-02-12 ENCOUNTER — Ambulatory Visit (HOSPITAL_COMMUNITY)
Admission: RE | Admit: 2013-02-12 | Discharge: 2013-02-12 | Disposition: A | Discharge status: Home / Self Care | Payer: BC Managed Care – PPO | Source: Ambulatory Visit | Attending: Cardiovascular Disease | Admitting: Cardiovascular Disease

## 2013-02-12 DIAGNOSIS — I341 Nonrheumatic mitral (valve) prolapse: Secondary | ICD-10-CM

## 2013-02-12 DIAGNOSIS — I059 Rheumatic mitral valve disease, unspecified: Secondary | ICD-10-CM | POA: Insufficient documentation

## 2013-02-12 DIAGNOSIS — R002 Palpitations: Secondary | ICD-10-CM | POA: Insufficient documentation

## 2013-02-12 NOTE — Progress Notes (Signed)
2D Echo Performed 02/12/2013    Krissy Orebaugh, RCS  

## 2013-02-27 ENCOUNTER — Telehealth: Payer: Self-pay | Admitting: *Deleted

## 2013-02-27 NOTE — Telephone Encounter (Signed)
Spoke to patient. echo Result given . Verbalized understanding  

## 2013-03-14 ENCOUNTER — Other Ambulatory Visit: Payer: Self-pay | Admitting: Gastroenterology

## 2013-03-24 ENCOUNTER — Telehealth: Payer: Self-pay | Admitting: *Deleted

## 2013-03-24 NOTE — Telephone Encounter (Signed)
I am not overly concerned about BPs in 140/80 range -- would only consider treating BP if consistently elevated. BP has not been an issue in the past.  If long term BP medications are going to be needed, simply due to her "intolerance" of atenolol, I would opt for a different class of medications.  Dr. Clelia Croft is also capable of addressing HTN.  For now, I would prefer to hold off on restarting Atenolol until we see a consistent trend towards elevated BPs.  Marykay Lex, MD

## 2013-03-24 NOTE — Telephone Encounter (Signed)
Returned call and pt verified x 2.  Pt informed message received and chart reviewed.  Pt informed last OV note did not indicate that she was taking atenolol and that Dr. [name] considered starting her on low dose metoprolol or diltiazem if she was started on prednisone for lupus.  Pt stated Dr. [name] stopped atenolol before and she was on 50 mg.  Stated they did have an extensive discussion at her last OV and discussed atenolol.  Pt stated she doesn't need it b/c she is on prednisone, but b/c her BP was elevated at her other doctor's appts.  RN asked pt for BPs: 140/90 and 140/83.  Pt informed while they are elevated they are not critical.  Pt informed Dr. [name] is out of the office until next week and another provider will need to be notified to advise on refilling atenolol.  Pt stated she would prefer Dr. [name] be notified instead of someone who doesn't know her.  Pt informed message will be sent to Dr. [name] to address at his earliest convenience.  Pt does not have home monitor, but can go to pharmacy to periodically check BP.  Pt informed she will be notified when a response is given.  Pt verbalized understanding and agreed w/ plan.  Message forwarded to Dr. [name].

## 2013-03-24 NOTE — Telephone Encounter (Signed)
Pt called stating that she needs an Rx called in for Atenonol 50mg . She said that it was prescribed by another physician and Dr. [name] said that if she needed it that he would call her in a Rx,  [facility]

## 2013-03-24 NOTE — Telephone Encounter (Signed)
Returned call and informed pt per instructions by MD.  Pt verbalized understanding and agreed w/ plan.  

## 2013-06-20 ENCOUNTER — Encounter: Payer: Self-pay | Admitting: *Deleted

## 2013-09-25 ENCOUNTER — Telehealth: Payer: Self-pay | Admitting: Cardiology

## 2013-09-25 NOTE — Telephone Encounter (Signed)
Patient called back . She states [facility] has not received faxed. RN  refaxed letter

## 2013-09-25 NOTE — Telephone Encounter (Signed)
Ok to take low dose atenolol (25 mg) as needed for palpitations/tachycardia, while on prednisone. Please provide a note.  Dr. Lemmie Evens

## 2013-09-25 NOTE — Telephone Encounter (Signed)
Pt says she need to take Prednisone,Dr Ellyn Hack told her if she ever had to take this to contact him. He said he would put her on Atenolol. Please Fax this approval to [facility].Pt says she already have some Atenolol.

## 2013-09-25 NOTE — Telephone Encounter (Signed)
FAXED LETER TO [facility] NOTIFIED PATIENT.
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
## 2013-10-24 ENCOUNTER — Telehealth: Payer: Self-pay | Admitting: Cardiology

## 2013-10-24 MED ORDER — ATENOLOL 25 MG PO TABS: 25.0000 mg | ORAL_TABLET | ORAL | Status: DC | PRN

## 2013-10-24 NOTE — Telephone Encounter (Signed)
Need a new prescription for Atenolol 50 mg#30. Please call to [facility].

## 2013-10-24 NOTE — Telephone Encounter (Signed)
Spoke with patient who was calling for more atenolol, Dr.Hilty had approved a low dose while patient was on prednisone. Patient states she is still having palpitations and chest discomfort and blood pressure is "Sky High" at 145/93 which is causing a constant. She is requesting more Atenolol. I informed patient I would need to speak with a physician first and would get back in touch with her, that she may just need to come in. I will speak with Dr.Croitoru, DOD.

## 2013-10-24 NOTE — Telephone Encounter (Signed)
After speaking with Dr. Loletha Grayer, he gave clearance for patient to have 25mg  atenolol for one month and to make appointment to be seen to evaluate chest discomfort and medications. Called patient back and explained. Patient voiced understanding

## 2013-11-10 ENCOUNTER — Other Ambulatory Visit (HOSPITAL_COMMUNITY): Payer: Self-pay | Admitting: Obstetrics and Gynecology

## 2013-11-10 DIAGNOSIS — Z1231 Encounter for screening mammogram for malignant neoplasm of breast: Secondary | ICD-10-CM

## 2013-11-24 ENCOUNTER — Encounter: Payer: Self-pay | Admitting: Cardiology

## 2013-11-24 ENCOUNTER — Ambulatory Visit (INDEPENDENT_AMBULATORY_CARE_PROVIDER_SITE_OTHER): Payer: BC Managed Care – PPO | Admitting: Cardiology

## 2013-11-24 VITALS — BP 88/66 | HR 75 | Ht 66.0 in | Wt 156.0 lb

## 2013-11-24 DIAGNOSIS — I951 Orthostatic hypotension: Secondary | ICD-10-CM

## 2013-11-24 DIAGNOSIS — I341 Nonrheumatic mitral (valve) prolapse: Secondary | ICD-10-CM

## 2013-11-24 DIAGNOSIS — I059 Rheumatic mitral valve disease, unspecified: Secondary | ICD-10-CM

## 2013-11-24 DIAGNOSIS — R002 Palpitations: Secondary | ICD-10-CM

## 2013-11-24 MED ORDER — ATENOLOL 25 MG PO TABS: 12.5000 mg | ORAL_TABLET | Freq: Two times a day (BID) | ORAL | Status: DC

## 2013-11-24 NOTE — Patient Instructions (Signed)
CHANGE MEDICATION-- Atenolol 25 mg  take 1/2 tablet twice a daily  Continue with current medications  Your physician wants you to follow-up in Jan 2016 .30 min. appointment.  You will receive a reminder letter in the mail two months in advance. If you don't receive a letter, please call our office to schedule the follow-up appointment.

## 2013-11-26 DIAGNOSIS — I951 Orthostatic hypotension: Secondary | ICD-10-CM | POA: Insufficient documentation

## 2013-11-26 NOTE — Assessment & Plan Note (Signed)
Only mild with no real audible murmur. At this point would just continue to monitor for worsening murmur.
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
## 2013-11-26 NOTE — Assessment & Plan Note (Signed)
Her blood pressure today makes it very difficult to consider adding a medication for palpitations. Hopefully with better hydration and eating her blood pressure will improve.

## 2013-11-26 NOTE — Assessment & Plan Note (Addendum)
For the most part these episodes are being driven by her anxiety and being on the prednisone. Her blood pressure is borderline today being in the 80s. What I think we can do is split the atenolol to 12-1/2 and do it twice a day to avoid the profound hypotension effect. She needs to make sure she adequate hydrate as herself and eats enough. I think that's one of the problems that she mentioned near the end is that she's not eating as well because of anxiety. Would need to closely monitor her blood pressures and if they are low hold her dose of atenolol. I don't think I would like to have her on long-term beta blocker, however. I think the main complement of treating her symptoms is anxiolytic therapy.

## 2013-12-25 ENCOUNTER — Ambulatory Visit (HOSPITAL_COMMUNITY)
Admission: RE | Admit: 2013-12-25 | Discharge: 2013-12-25 | Disposition: A | Discharge status: Home / Self Care | Payer: BC Managed Care – PPO | Source: Ambulatory Visit | Attending: Obstetrics and Gynecology | Admitting: Obstetrics and Gynecology

## 2013-12-25 DIAGNOSIS — Z1231 Encounter for screening mammogram for malignant neoplasm of breast: Secondary | ICD-10-CM | POA: Diagnosis present

## 2014-01-26 ENCOUNTER — Encounter: Payer: Self-pay | Admitting: Cardiology

## 2014-01-29 ENCOUNTER — Ambulatory Visit: Payer: BC Managed Care – PPO | Admitting: Cardiology

## 2014-05-01 ENCOUNTER — Ambulatory Visit: Payer: BC Managed Care – PPO | Admitting: Cardiology

## 2014-10-09 ENCOUNTER — Encounter: Payer: Self-pay | Admitting: Cardiology

## 2014-10-27 ENCOUNTER — Other Ambulatory Visit: Payer: Self-pay

## 2014-10-27 DIAGNOSIS — Z1231 Encounter for screening mammogram for malignant neoplasm of breast: Secondary | ICD-10-CM

## 2014-12-29 ENCOUNTER — Ambulatory Visit: Payer: Self-pay

## 2015-01-13 ENCOUNTER — Other Ambulatory Visit: Payer: Self-pay | Admitting: Cardiology

## 2015-04-07 ENCOUNTER — Other Ambulatory Visit: Payer: Self-pay | Admitting: Cardiology

## 2015-08-11 DIAGNOSIS — Z0000 Encounter for general adult medical examination without abnormal findings: Secondary | ICD-10-CM | POA: Diagnosis not present

## 2015-08-11 DIAGNOSIS — E784 Other hyperlipidemia: Secondary | ICD-10-CM | POA: Diagnosis not present

## 2015-08-18 DIAGNOSIS — F430 Acute stress reaction: Secondary | ICD-10-CM | POA: Diagnosis not present

## 2015-08-18 DIAGNOSIS — Z1389 Encounter for screening for other disorder: Secondary | ICD-10-CM | POA: Diagnosis not present

## 2015-08-18 DIAGNOSIS — F329 Major depressive disorder, single episode, unspecified: Secondary | ICD-10-CM | POA: Diagnosis not present

## 2015-08-18 DIAGNOSIS — L930 Discoid lupus erythematosus: Secondary | ICD-10-CM | POA: Diagnosis not present

## 2015-08-18 DIAGNOSIS — Z0000 Encounter for general adult medical examination without abnormal findings: Secondary | ICD-10-CM | POA: Diagnosis not present

## 2015-08-18 DIAGNOSIS — E784 Other hyperlipidemia: Secondary | ICD-10-CM | POA: Diagnosis not present

## 2015-08-20 DIAGNOSIS — Z1212 Encounter for screening for malignant neoplasm of rectum: Secondary | ICD-10-CM | POA: Diagnosis not present

## 2015-08-25 DIAGNOSIS — H1032 Unspecified acute conjunctivitis, left eye: Secondary | ICD-10-CM | POA: Diagnosis not present

## 2016-02-07 DIAGNOSIS — S46811A Strain of other muscles, fascia and tendons at shoulder and upper arm level, right arm, initial encounter: Secondary | ICD-10-CM | POA: Diagnosis not present

## 2016-02-07 DIAGNOSIS — M25511 Pain in right shoulder: Secondary | ICD-10-CM | POA: Diagnosis not present

## 2016-02-07 DIAGNOSIS — S46911A Strain of unspecified muscle, fascia and tendon at shoulder and upper arm level, right arm, initial encounter: Secondary | ICD-10-CM | POA: Diagnosis not present

## 2016-03-07 DIAGNOSIS — L720 Epidermal cyst: Secondary | ICD-10-CM | POA: Diagnosis not present

## 2016-03-16 DIAGNOSIS — Z6826 Body mass index (BMI) 26.0-26.9, adult: Secondary | ICD-10-CM | POA: Diagnosis not present

## 2016-03-16 DIAGNOSIS — Z01419 Encounter for gynecological examination (general) (routine) without abnormal findings: Secondary | ICD-10-CM | POA: Diagnosis not present

## 2016-03-16 DIAGNOSIS — N951 Menopausal and female climacteric states: Secondary | ICD-10-CM | POA: Diagnosis not present

## 2016-03-16 DIAGNOSIS — Z1231 Encounter for screening mammogram for malignant neoplasm of breast: Secondary | ICD-10-CM | POA: Diagnosis not present

## 2016-07-04 DIAGNOSIS — H52203 Unspecified astigmatism, bilateral: Secondary | ICD-10-CM | POA: Diagnosis not present

## 2016-07-04 DIAGNOSIS — H25013 Cortical age-related cataract, bilateral: Secondary | ICD-10-CM | POA: Diagnosis not present

## 2016-08-17 DIAGNOSIS — E784 Other hyperlipidemia: Secondary | ICD-10-CM | POA: Diagnosis not present

## 2016-08-17 DIAGNOSIS — Z0000 Encounter for general adult medical examination without abnormal findings: Secondary | ICD-10-CM | POA: Diagnosis not present

## 2016-08-24 DIAGNOSIS — Z1389 Encounter for screening for other disorder: Secondary | ICD-10-CM | POA: Diagnosis not present

## 2016-08-24 DIAGNOSIS — E784 Other hyperlipidemia: Secondary | ICD-10-CM | POA: Diagnosis not present

## 2016-08-24 DIAGNOSIS — M25511 Pain in right shoulder: Secondary | ICD-10-CM | POA: Diagnosis not present

## 2016-08-24 DIAGNOSIS — Z0000 Encounter for general adult medical examination without abnormal findings: Secondary | ICD-10-CM | POA: Diagnosis not present

## 2016-08-24 DIAGNOSIS — F3289 Other specified depressive episodes: Secondary | ICD-10-CM | POA: Diagnosis not present

## 2016-08-24 DIAGNOSIS — L930 Discoid lupus erythematosus: Secondary | ICD-10-CM | POA: Diagnosis not present

## 2016-08-25 DIAGNOSIS — Z1212 Encounter for screening for malignant neoplasm of rectum: Secondary | ICD-10-CM | POA: Diagnosis not present

## 2016-09-06 DIAGNOSIS — S46811D Strain of other muscles, fascia and tendons at shoulder and upper arm level, right arm, subsequent encounter: Secondary | ICD-10-CM | POA: Diagnosis not present

## 2016-09-14 DIAGNOSIS — L293 Anogenital pruritus, unspecified: Secondary | ICD-10-CM | POA: Diagnosis not present

## 2016-12-27 DIAGNOSIS — S46811D Strain of other muscles, fascia and tendons at shoulder and upper arm level, right arm, subsequent encounter: Secondary | ICD-10-CM | POA: Diagnosis not present

## 2017-03-16 DIAGNOSIS — R300 Dysuria: Secondary | ICD-10-CM | POA: Diagnosis not present

## 2017-03-21 DIAGNOSIS — Z13820 Encounter for screening for osteoporosis: Secondary | ICD-10-CM | POA: Diagnosis not present

## 2017-03-21 DIAGNOSIS — Z1231 Encounter for screening mammogram for malignant neoplasm of breast: Secondary | ICD-10-CM | POA: Diagnosis not present

## 2017-03-29 DIAGNOSIS — Z01419 Encounter for gynecological examination (general) (routine) without abnormal findings: Secondary | ICD-10-CM | POA: Diagnosis not present

## 2017-03-29 DIAGNOSIS — N390 Urinary tract infection, site not specified: Secondary | ICD-10-CM | POA: Diagnosis not present

## 2017-03-29 DIAGNOSIS — Z6827 Body mass index (BMI) 27.0-27.9, adult: Secondary | ICD-10-CM | POA: Diagnosis not present

## 2017-03-29 DIAGNOSIS — Z79890 Hormone replacement therapy (postmenopausal): Secondary | ICD-10-CM | POA: Diagnosis not present

## 2017-07-09 DIAGNOSIS — H25013 Cortical age-related cataract, bilateral: Secondary | ICD-10-CM | POA: Diagnosis not present

## 2017-07-09 DIAGNOSIS — H10413 Chronic giant papillary conjunctivitis, bilateral: Secondary | ICD-10-CM | POA: Diagnosis not present

## 2017-07-09 DIAGNOSIS — H52203 Unspecified astigmatism, bilateral: Secondary | ICD-10-CM | POA: Diagnosis not present

## 2017-09-04 DIAGNOSIS — R82998 Other abnormal findings in urine: Secondary | ICD-10-CM | POA: Diagnosis not present

## 2017-09-04 DIAGNOSIS — E7849 Other hyperlipidemia: Secondary | ICD-10-CM | POA: Diagnosis not present

## 2017-09-11 DIAGNOSIS — Z0000 Encounter for general adult medical examination without abnormal findings: Secondary | ICD-10-CM | POA: Diagnosis not present

## 2017-09-11 DIAGNOSIS — F430 Acute stress reaction: Secondary | ICD-10-CM | POA: Diagnosis not present

## 2017-09-11 DIAGNOSIS — E7849 Other hyperlipidemia: Secondary | ICD-10-CM | POA: Diagnosis not present

## 2017-09-11 DIAGNOSIS — L930 Discoid lupus erythematosus: Secondary | ICD-10-CM | POA: Diagnosis not present

## 2017-09-11 DIAGNOSIS — F3289 Other specified depressive episodes: Secondary | ICD-10-CM | POA: Diagnosis not present

## 2017-09-13 DIAGNOSIS — Z1212 Encounter for screening for malignant neoplasm of rectum: Secondary | ICD-10-CM | POA: Diagnosis not present

## 2018-01-16 DIAGNOSIS — T1512XA Foreign body in conjunctival sac, left eye, initial encounter: Secondary | ICD-10-CM | POA: Diagnosis not present

## 2018-03-06 DIAGNOSIS — R3915 Urgency of urination: Secondary | ICD-10-CM | POA: Diagnosis not present

## 2018-03-25 DIAGNOSIS — Z1231 Encounter for screening mammogram for malignant neoplasm of breast: Secondary | ICD-10-CM | POA: Diagnosis not present

## 2018-04-01 DIAGNOSIS — Z1211 Encounter for screening for malignant neoplasm of colon: Secondary | ICD-10-CM | POA: Diagnosis not present

## 2018-04-01 DIAGNOSIS — Z800 Family history of malignant neoplasm of digestive organs: Secondary | ICD-10-CM | POA: Diagnosis not present

## 2018-04-01 DIAGNOSIS — K649 Unspecified hemorrhoids: Secondary | ICD-10-CM | POA: Diagnosis not present

## 2018-04-03 ENCOUNTER — Encounter: Payer: Self-pay | Admitting: Cardiology

## 2018-04-03 ENCOUNTER — Ambulatory Visit: Payer: Self-pay | Admitting: Cardiology

## 2018-04-03 ENCOUNTER — Ambulatory Visit: Payer: BLUE CROSS/BLUE SHIELD | Admitting: Cardiology

## 2018-04-03 VITALS — BP 142/88 | HR 57 | Ht 65.0 in | Wt 139.0 lb

## 2018-04-03 DIAGNOSIS — E876 Hypokalemia: Secondary | ICD-10-CM

## 2018-04-03 DIAGNOSIS — R002 Palpitations: Secondary | ICD-10-CM | POA: Diagnosis not present

## 2018-04-03 DIAGNOSIS — I341 Nonrheumatic mitral (valve) prolapse: Secondary | ICD-10-CM

## 2018-04-03 DIAGNOSIS — R609 Edema, unspecified: Secondary | ICD-10-CM | POA: Diagnosis not present

## 2018-04-03 LAB — BASIC METABOLIC PANEL
BUN/Creatinine Ratio: 10 — ABNORMAL LOW (ref 12–28)
BUN: 9 mg/dL (ref 8–27)
CO2: 25 mmol/L (ref 20–29)
Calcium: 9.4 mg/dL (ref 8.7–10.3)
Chloride: 104 mmol/L (ref 96–106)
Creatinine, Ser: 0.92 mg/dL (ref 0.57–1.00)
GFR calc Af Amer: 76 mL/min/{1.73_m2} (ref >59)
GFR calc non Af Amer: 66 mL/min/{1.73_m2} (ref >59)
GLUCOSE: 82 mg/dL (ref 65–99)
Potassium: 3.6 mmol/L (ref 3.5–5.2)
Sodium: 141 mmol/L (ref 134–144)

## 2018-04-03 MED ORDER — FUROSEMIDE 20 MG PO TABS: ORAL_TABLET | ORAL | 0 refills | Status: DC

## 2018-04-03 MED ORDER — POTASSIUM CHLORIDE CRYS ER 20 MEQ PO TBCR: EXTENDED_RELEASE_TABLET | ORAL | 0 refills | Status: DC

## 2018-04-03 NOTE — Patient Instructions (Addendum)
Medication Instructions:  MAY USE LASIX ( FUROSEMIDE) 20 MG  1 TO 2 TABLETS  2 TO 3  DAYS AS NEED FOR SWELLING. Take 20 meq tablet of potassium with each dose of lasix ( furosemide) If you need a refill on your cardiac medications before your next appointment, please call your pharmacy.   Lab work:  [employer] TODAY TAKE MEDICATION AFTER RESULT ARE GIVEN   If you have labs (blood work) drawn today and your tests are completely normal, you will receive your results only by: Marland Kitchen MyChart Message (if you have MyChart) OR . A paper copy in the mail If you have any lab test that is abnormal or we need to change your treatment, we will call you to review the results.  Testing/Procedures: NOT NEEDED  Follow-Up: At [facility], you and your health needs are our priority.  As part of our continuing mission to provide you with exceptional heart care, we have created designated Provider Care Teams.  These Care Teams include your primary Cardiologist (physician) and Advanced Practice Providers (APPs -  Physician Assistants and Nurse Practitioners) who all work together to provide you with the care you need, when you need it. You will need a follow up appointment in 12 months Jan 2020.  Please call our office 2 months in advance to schedule this appointment.  You may see Glenetta Hew, MD  or one of the following Advanced Practice Providers on your designated Care Team:   Rosaria Ferries, PA-C . Jory Sims, DNP, ANP  Any Other Special Instructions Will Be Listed Below (If Applicable).    DO NOT NEED ANTIBIOTICS FOR ANY DENTAL PROCEDURES

## 2018-04-03 NOTE — Assessment & Plan Note (Signed)
Controlled with exercise.  Rarely uses PRN Beta blocker -- not taking as standing dose.

## 2018-04-03 NOTE — Assessment & Plan Note (Addendum)
Probably fluid retention following bowel cleanse -- will check BMP for K+ level --> if OK can Rx Lasix 20 mg daily (for 2-3 days).  Rx 1 week worth (if K+ level is borderline - would use PRN 20 mEq K-Dur for each 20 mg Lasix).

## 2018-04-03 NOTE — Assessment & Plan Note (Signed)
Essentially non-diagnostic findings on Echo with no audible murmur -- Does not require SBE prophylaxis for dental/GI procedures.  No need to recheck echo unless Murmur gets louder.

## 2018-04-03 NOTE — Assessment & Plan Note (Signed)
With recent GI prep & loose stool - concern for low potassium levels  Need to check BMP prior to using Lasix
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
## 2018-04-09 DIAGNOSIS — Z01419 Encounter for gynecological examination (general) (routine) without abnormal findings: Secondary | ICD-10-CM | POA: Diagnosis not present

## 2018-04-09 DIAGNOSIS — Z6822 Body mass index (BMI) 22.0-22.9, adult: Secondary | ICD-10-CM | POA: Diagnosis not present

## 2018-04-09 DIAGNOSIS — Z1231 Encounter for screening mammogram for malignant neoplasm of breast: Secondary | ICD-10-CM | POA: Diagnosis not present

## 2018-04-09 DIAGNOSIS — Z1211 Encounter for screening for malignant neoplasm of colon: Secondary | ICD-10-CM | POA: Diagnosis not present

## 2018-07-25 ENCOUNTER — Other Ambulatory Visit: Payer: Self-pay | Admitting: Cardiology

## 2018-07-25 NOTE — Telephone Encounter (Signed)
Furosemide and Klor-Con refilled/

## 2018-08-14 DIAGNOSIS — M25561 Pain in right knee: Secondary | ICD-10-CM | POA: Insufficient documentation

## 2018-08-20 DIAGNOSIS — M25561 Pain in right knee: Secondary | ICD-10-CM | POA: Diagnosis not present

## 2018-08-20 DIAGNOSIS — M25562 Pain in left knee: Secondary | ICD-10-CM | POA: Diagnosis not present

## 2018-08-20 DIAGNOSIS — M170 Bilateral primary osteoarthritis of knee: Secondary | ICD-10-CM | POA: Diagnosis not present

## 2018-08-28 DIAGNOSIS — M79672 Pain in left foot: Secondary | ICD-10-CM | POA: Diagnosis not present

## 2018-09-12 DIAGNOSIS — E7849 Other hyperlipidemia: Secondary | ICD-10-CM | POA: Diagnosis not present

## 2018-09-12 DIAGNOSIS — Z0000 Encounter for general adult medical examination without abnormal findings: Secondary | ICD-10-CM | POA: Diagnosis not present

## 2018-09-12 DIAGNOSIS — R82998 Other abnormal findings in urine: Secondary | ICD-10-CM | POA: Diagnosis not present

## 2018-09-19 DIAGNOSIS — M84375A Stress fracture, left foot, initial encounter for fracture: Secondary | ICD-10-CM | POA: Diagnosis not present

## 2018-09-19 DIAGNOSIS — Z0000 Encounter for general adult medical examination without abnormal findings: Secondary | ICD-10-CM | POA: Diagnosis not present

## 2018-09-19 DIAGNOSIS — F329 Major depressive disorder, single episode, unspecified: Secondary | ICD-10-CM | POA: Diagnosis not present

## 2018-09-19 DIAGNOSIS — Z1331 Encounter for screening for depression: Secondary | ICD-10-CM | POA: Diagnosis not present

## 2018-09-19 DIAGNOSIS — F430 Acute stress reaction: Secondary | ICD-10-CM | POA: Diagnosis not present

## 2018-09-19 DIAGNOSIS — E785 Hyperlipidemia, unspecified: Secondary | ICD-10-CM | POA: Diagnosis not present

## 2018-09-24 ENCOUNTER — Other Ambulatory Visit: Payer: Self-pay | Admitting: Internal Medicine

## 2018-09-24 DIAGNOSIS — E785 Hyperlipidemia, unspecified: Secondary | ICD-10-CM

## 2018-10-16 DIAGNOSIS — N898 Other specified noninflammatory disorders of vagina: Secondary | ICD-10-CM | POA: Diagnosis not present

## 2018-10-17 ENCOUNTER — Other Ambulatory Visit: Payer: Self-pay | Admitting: Cardiology

## 2018-10-28 ENCOUNTER — Other Ambulatory Visit: Payer: BLUE CROSS/BLUE SHIELD

## 2018-10-31 DIAGNOSIS — M25561 Pain in right knee: Secondary | ICD-10-CM | POA: Diagnosis not present

## 2018-10-31 DIAGNOSIS — M25562 Pain in left knee: Secondary | ICD-10-CM | POA: Diagnosis not present

## 2018-11-19 DIAGNOSIS — M25562 Pain in left knee: Secondary | ICD-10-CM | POA: Diagnosis not present

## 2018-11-27 DIAGNOSIS — M25562 Pain in left knee: Secondary | ICD-10-CM | POA: Diagnosis not present

## 2018-11-27 DIAGNOSIS — M25561 Pain in right knee: Secondary | ICD-10-CM | POA: Diagnosis not present

## 2018-12-04 DIAGNOSIS — M25561 Pain in right knee: Secondary | ICD-10-CM | POA: Diagnosis not present

## 2018-12-04 DIAGNOSIS — M25562 Pain in left knee: Secondary | ICD-10-CM | POA: Diagnosis not present

## 2018-12-04 DIAGNOSIS — M170 Bilateral primary osteoarthritis of knee: Secondary | ICD-10-CM | POA: Diagnosis not present

## 2018-12-04 DIAGNOSIS — S83282A Other tear of lateral meniscus, current injury, left knee, initial encounter: Secondary | ICD-10-CM | POA: Diagnosis not present

## 2018-12-11 DIAGNOSIS — M23222 Derangement of posterior horn of medial meniscus due to old tear or injury, left knee: Secondary | ICD-10-CM | POA: Diagnosis not present

## 2018-12-11 DIAGNOSIS — S83282A Other tear of lateral meniscus, current injury, left knee, initial encounter: Secondary | ICD-10-CM | POA: Diagnosis not present

## 2018-12-11 DIAGNOSIS — M659 Synovitis and tenosynovitis, unspecified: Secondary | ICD-10-CM | POA: Diagnosis not present

## 2018-12-11 DIAGNOSIS — M23242 Derangement of anterior horn of lateral meniscus due to old tear or injury, left knee: Secondary | ICD-10-CM | POA: Diagnosis not present

## 2018-12-11 DIAGNOSIS — S83232A Complex tear of medial meniscus, current injury, left knee, initial encounter: Secondary | ICD-10-CM | POA: Diagnosis not present

## 2018-12-11 DIAGNOSIS — M94262 Chondromalacia, left knee: Secondary | ICD-10-CM | POA: Diagnosis not present

## 2018-12-31 DIAGNOSIS — M25561 Pain in right knee: Secondary | ICD-10-CM | POA: Diagnosis not present

## 2018-12-31 DIAGNOSIS — M25562 Pain in left knee: Secondary | ICD-10-CM | POA: Diagnosis not present

## 2018-12-31 DIAGNOSIS — F329 Major depressive disorder, single episode, unspecified: Secondary | ICD-10-CM | POA: Diagnosis not present

## 2018-12-31 DIAGNOSIS — R635 Abnormal weight gain: Secondary | ICD-10-CM | POA: Diagnosis not present

## 2019-04-01 DIAGNOSIS — H25013 Cortical age-related cataract, bilateral: Secondary | ICD-10-CM | POA: Diagnosis not present

## 2019-04-01 DIAGNOSIS — H52203 Unspecified astigmatism, bilateral: Secondary | ICD-10-CM | POA: Diagnosis not present

## 2019-04-10 ENCOUNTER — Encounter (INDEPENDENT_AMBULATORY_CARE_PROVIDER_SITE_OTHER): Payer: Self-pay

## 2019-04-10 ENCOUNTER — Other Ambulatory Visit: Payer: Self-pay

## 2019-04-10 ENCOUNTER — Encounter: Payer: Self-pay | Admitting: Cardiology

## 2019-04-10 ENCOUNTER — Ambulatory Visit: Payer: BLUE CROSS/BLUE SHIELD | Admitting: Cardiology

## 2019-04-10 VITALS — BP 110/73 | HR 62 | Ht 66.0 in | Wt 139.6 lb

## 2019-04-10 DIAGNOSIS — I341 Nonrheumatic mitral (valve) prolapse: Secondary | ICD-10-CM

## 2019-04-10 DIAGNOSIS — R002 Palpitations: Secondary | ICD-10-CM | POA: Diagnosis not present

## 2019-04-10 DIAGNOSIS — I951 Orthostatic hypotension: Secondary | ICD-10-CM

## 2019-04-10 DIAGNOSIS — E782 Mixed hyperlipidemia: Secondary | ICD-10-CM

## 2019-04-10 NOTE — Progress Notes (Signed)
Primary Care Provider: Marton Redwood, MD Cardiologist: Glenetta Hew, MD Electrophysiologist:   Clinic Note: Chief Complaint  Patient presents with  . Follow-up    Doing well  . Palpitations    Well-controlled    HPI:    Sharon Kramer is a 64 y.o. female with a PMH below who presents today for annual follow-up with history of palpitations and swelling & ? Mild MVP.Marland Kitchen  Sharon Kramer was last seen on January 2020.  She is doing relatively well.  She started a medicine prior to her colonoscopy for shorter prep and had significant swelling episode.  Plan as needed Lasix  Recent Hospitalizations: None  Reviewed  CV studies:    The following studies were reviewed today: (if available, images/films reviewed: From Epic Chart or Care Everywhere) . None:  Interval History:   Sharon Kramer returns actually doing fairly well.  She says he has occasional hand swelling but her palpitations are pretty well controlled.  None the major things is bothering her now is Raynaud's in her hands and feet.  She has to wear gloves and long socks to keep her self from having bad spells.  Overall from cardiac standpoint though she is doing fairly well.  Palpitations are controlled blood pressure looks good.  CV Review of Symptoms (Summary) Cardiovascular ROS: no chest pain or dyspnea on exertion positive for - Raynaud's negative for - edema, irregular heartbeat, orthopnea, palpitations, paroxysmal nocturnal dyspnea, rapid heart rate, shortness of breath or Palpitations well controlled.  Few skipped beats but nothing prolonged  The patient does not have symptoms concerning for COVID-19 infection (fever, chills, cough, or new shortness of breath).  The patient is practicing social distancing & Masking.     REVIEWED OF SYSTEMS   A comprehensive ROS was performed with pertinent cardiovascular symptoms noted above.  Otherwise negative . Review of Systems  Constitutional: Negative for  malaise/fatigue and weight loss.  HENT: Negative for congestion and nosebleeds.   Respiratory: Negative for wheezing.   Cardiovascular: Negative for leg swelling.       She does have Raynaud's issues with her hands and feet.  Gastrointestinal: Negative for abdominal pain, blood in stool and melena.  Genitourinary: Negative for hematuria.  Musculoskeletal: Negative for joint pain.  Neurological: Negative for dizziness.  Endo/Heme/Allergies: Negative for environmental allergies.  Psychiatric/Behavioral: Negative.      I have reviewed and (if needed) personally updated the patient's problem list, medications, allergies, past medical and surgical history, social and family history.   PAST MEDICAL HISTORY   Past Medical History:  Diagnosis Date  . Allergy   . Anemia   . Anxiety disorder   . Depression   . Fibroids   . H/O syncope   . Heart palpitations   . Lupus ([place])    skin   . Mitral valvular prolapse    Mild with mild MR     PAST SURGICAL HISTORY   Past Surgical History:  Procedure Laterality Date  . MYOMECTOMY    . TONSILLECTOMY AND ADENOIDECTOMY    . TOTAL ABDOMINAL HYSTERECTOMY    . TRANSTHORACIC ECHOCARDIOGRAM     Normal LV Function; Mild MV prolapse with mild MR.  . WISDOM TOOTH EXTRACTION       MEDICATIONS/ALLERGIES   Current Meds  Medication Sig  . atenolol (TENORMIN) 25 MG tablet take 1/2 tablet by mouth twice a day  . clonazePAM (KLONOPIN) 1 MG tablet   . EPINEPHrine (EPIPEN JR) 0.15 MG/0.3ML injection Inject 0.15  mg into the muscle as needed.  Marland Kitchen estradiol (CLIMARA - DOSED IN MG/24 HR) 0.025 mg/24hr patch Place 0.025 mg onto the skin every 14 (fourteen) days.  . Multiple Vitamin (MULTIVITAMIN) tablet Take 1 tablet by mouth daily.    Allergies  Allergen Reactions  . Peanut-Containing Drug Products   . Penicillins   . Dust Mite Extract   . Peanut Oil      SOCIAL HISTORY/FAMILY HISTORY   Social History   Tobacco Use  . Smoking status: Never  Smoker  . Smokeless tobacco: Never Used  Substance Use Topics  . Alcohol use: No  . Drug use: No   Social History   Social History Narrative   Married, mother of 3.   No Smoking; No EtoH   Exercises - ~4 d/ week.    Family History family history includes Cancer in her mother; Colon cancer (age of onset: 67) in her father; Obesity in her sister.   OBJCTIVE -PE, EKG, labs   Wt Readings from Last 3 Encounters:  04/10/19 139 lb 9.6 oz (63.3 kg)  04/03/18 139 lb (63 kg)  11/24/13 156 lb (70.8 kg)    Physical Exam: BP 110/73   Pulse 62   Ht 5\' 6"  (1.676 m)   Wt 139 lb 9.6 oz (63.3 kg)   SpO2 100%   BMI 22.53 kg/m  Physical Exam  Constitutional: She is oriented to person, place, and time. She appears well-developed and well-nourished. No distress.  Healthy-appearing.  Appears much younger than stated age  HENT:  Head: Normocephalic and atraumatic.  Neck: No hepatojugular reflux and no JVD present. Carotid bruit is not present. No tracheal deviation present. No thyromegaly present.  Cardiovascular: Normal rate, regular rhythm, normal heart sounds, intact distal pulses and normal pulses.  No extrasystoles are present. PMI is not displaced. Exam reveals no gallop and no friction rub.  No murmur heard. No obvious midsystolic click heard  Pulmonary/Chest: Effort normal and breath sounds normal. No respiratory distress. She has no wheezes. She has no rales.  Abdominal: Soft. Bowel sounds are normal. She exhibits no distension. There is no abdominal tenderness.  Musculoskeletal:        General: No edema. Normal range of motion.     Cervical back: Normal range of motion and neck supple.  Neurological: She is alert and oriented to person, place, and time.  Psychiatric: She has a normal mood and affect. Her behavior is normal. Judgment and thought content normal.  Vitals reviewed.   Adult ECG Report  Rate: 62 ;  Rhythm: normal sinus rhythm and Left atrial enlargement.  Otherwise  normal axis, intervals and durations.;   Narrative Interpretation: Normal EKG  Recent Labs: June 2020: TC 243, TG 45, HDL 81, LDL 153.  Hgb 12.5.  CR 1.0. No results found for: CHOL, HDL, LDLCALC, LDLDIRECT, TRIG, CHOLHDL Lab Results  Component Value Date   CREATININE 0.92 04/03/2018   BUN 9 04/03/2018   NA 141 04/03/2018   K 3.6 04/03/2018   CL 104 04/03/2018   CO2 25 04/03/2018    ASSESSMENT/PLAN    Problem List Items Addressed This Visit    Mitral valvular prolapse (Chronic)    Most recent echocardiogram said essentially nondiagnostic findings for echo.  No murmur heard although mild MR was seen on echo.  Also does not fully meet criteria for mitral prolapse.  As such, unless the murmur does worsen, I do not think we need to recheck an echocardiogram.  Given the most  current guidelines, she does not require SBE antibiotic prophylaxis for any procedures.      Relevant Orders   EKG 12-Lead (Completed)   Heart palpitations - Primary (Chronic)    Very well controlled on current dose of atenolol.  Continue to recommend avoiding triggers and maintaining adequate hydration      Relevant Orders   EKG 12-Lead (Completed)   Hyperlipidemia, mixed (Chronic)    LDL was pretty high despite having an HDL of 81, total/243 and LDL is 153.  There is been discussion about whether or not she should be on cholesterol-lowering medications.  We talked about different options of evaluating this, but the best screening potential would be coronary calcium score.  Her PCP had suggested that and I think is probably not unreasonable as this will help [place] determine her overall cardiac risk.  Plan: Coronary calcium score      Orthostatic hypotension    Blood pressure looks pretty good and stable.  She is tolerating her low-dose atenolol.  Doing better with it being twice daily than once daily.          COVID-19 Education: The signs and symptoms of COVID-19 were discussed with the patient and  how to seek care for testing (follow up with PCP or arrange E-visit).   The importance of social distancing was discussed today.  I spent a total of 50minutes with the patient and chart review. >  50% of the time was spent in direct patient consultation.   The patient continues to discuss her mitral prolapse.  She indicated that her dentist still wants her to take antibiotics for this.  Despite the fact that I have more than one occasion indicated that is not necessary. Additional time spent with chart review (studies, outside notes, etc): 5 Total Time: 26 min   Current medicines are reviewed at length with the patient today.  (+/- concerns) n/a   Patient Instructions / Medication Changes & Studies & Tests Ordered   Patient Instructions  Medication Instructions:  No changes *If you need a refill on your cardiac medications before your next appointment, please call your pharmacy*  Lab Work: Not needed  Testing/Procedures:  Dr Ellyn Hack recommends you having a Calcium cardiac scoring. See information sheet. Please contact office if you would to schedule  This test.  Follow-Up: At [facility], you and your health needs are our priority.  As part of our continuing mission to provide you with exceptional heart care, we have created designated Provider Care Teams.  These Care Teams include your primary Cardiologist (physician) and Advanced Practice Providers (APPs -  Physician Assistants and Nurse Practitioners) who all work together to provide you with the care you need, when you need it.  Your next appointment:   12 month(s)  The format for your next appointment:   In Person  Provider:   Glenetta Hew, MD    CT coronary calcium score. This test is done at [address]. This is $150 out of pocket.  Studies Ordered:   Orders Placed This Encounter  Procedures  . EKG 12-Lead     Glenetta Hew, M.D., M.S. Interventional Cardiologist   Pager #  [phone] Phone # [phone] [address] [place], [place] [address]   Thank you for choosing Heartcare at [facility]!!

## 2019-04-10 NOTE — Patient Instructions (Addendum)
Medication Instructions:  No changes *If you need a refill on your cardiac medications before your next appointment, please call your pharmacy*  Lab Work: Not needed  Testing/Procedures:  Dr Ellyn Hack recommends you having a Calcium cardiac scoring. See information sheet. Please contact office if you would to schedule  This test.  Follow-Up: At [facility], you and your health needs are our priority.  As part of our continuing mission to provide you with exceptional heart care, we have created designated Provider Care Teams.  These Care Teams include your primary Cardiologist (physician) and Advanced Practice Providers (APPs -  Physician Assistants and Nurse Practitioners) who all work together to provide you with the care you need, when you need it.  Your next appointment:   12 month(s)  The format for your next appointment:   In Person  Provider:   Glenetta Hew, MD    CT coronary calcium score. This test is done at [address]. This is $150 out of pocket.   Coronary CalciumScan A coronary calcium scan is an imaging test used to look for deposits of calcium and other fatty materials (plaques) in the inner lining of the blood vessels of the heart (coronary arteries). These deposits of calcium and plaques can partly clog and narrow the coronary arteries without producing any symptoms or warning signs. This puts a person at risk for a heart attack. This test can detect these deposits before symptoms develop. Tell a health care provider about:  Any allergies you have.  All medicines you are taking, including vitamins, herbs, eye drops, creams, and over-the-counter medicines.  Any problems you or family members have had with anesthetic medicines.  Any blood disorders you have.  Any surgeries you have had.  Any medical conditions you have.  Whether you are pregnant or may be pregnant. What are the risks? Generally, this is a safe procedure. However,  problems may occur, including:  Harm to a pregnant woman and her unborn baby. This test involves the use of radiation. Radiation exposure can be dangerous to a pregnant woman and her unborn baby. If you are pregnant, you generally should not have this procedure done.  Slight increase in the risk of cancer. This is because of the radiation involved in the test. What happens before the procedure? No preparation is needed for this procedure. What happens during the procedure?  You will undress and remove any jewelry around your neck or chest.  You will put on a hospital gown.  Sticky electrodes will be placed on your chest. The electrodes will be connected to an electrocardiogram (ECG) machine to record a tracing of the electrical activity of your heart.  A CT scanner will take pictures of your heart. During this time, you will be asked to lie still and hold your breath for 2-3 seconds while a picture of your heart is being taken. The procedure may vary among health care providers and hospitals. What happens after the procedure?  You can get dressed.  You can return to your normal activities.  It is up to you to get the results of your test. Ask your health care provider, or the department that is doing the test, when your results will be ready. Summary  A coronary calcium scan is an imaging test used to look for deposits of calcium and other fatty materials (plaques) in the inner lining of the blood vessels of the heart (coronary arteries).  Generally, this is a safe procedure. Tell your  health care provider if you are pregnant or may be pregnant.  No preparation is needed for this procedure.  A CT scanner will take pictures of your heart.  You can return to your normal activities after the scan is done. This information is not intended to replace advice given to you by your health care provider. Make sure you discuss any questions you have with your health care provider. Document  Released: 09/09/2007 Document Revised: 01/31/2016 Document Reviewed: 01/31/2016 Elsevier Interactive Patient Education  2017 [employer].

## 2019-04-13 ENCOUNTER — Encounter: Payer: Self-pay | Admitting: Cardiology

## 2019-04-13 DIAGNOSIS — E782 Mixed hyperlipidemia: Secondary | ICD-10-CM | POA: Insufficient documentation

## 2019-04-13 NOTE — Assessment & Plan Note (Signed)
Most recent echocardiogram said essentially nondiagnostic findings for echo.  No murmur heard although mild MR was seen on echo.  Also does not fully meet criteria for mitral prolapse.  As such, unless the murmur does worsen, I do not think we need to recheck an echocardiogram.  Given the most current guidelines, she does not require SBE antibiotic prophylaxis for any procedures.

## 2019-04-13 NOTE — Assessment & Plan Note (Signed)
Blood pressure looks pretty good and stable.  She is tolerating her low-dose atenolol.  Doing better with it being twice daily than once daily.

## 2019-04-13 NOTE — Assessment & Plan Note (Signed)
LDL was pretty high despite having an HDL of 81, total/243 and LDL is 153.  There is been discussion about whether or not she should be on cholesterol-lowering medications.  We talked about different options of evaluating this, but the best screening potential would be coronary calcium score.  Her PCP had suggested that and I think is probably not unreasonable as this will help [place] determine her overall cardiac risk.  Plan: Coronary calcium score

## 2019-04-13 NOTE — Assessment & Plan Note (Signed)
Very well controlled on current dose of atenolol.  Continue to recommend avoiding triggers and maintaining adequate hydration

## 2019-04-14 ENCOUNTER — Telehealth: Payer: Self-pay | Admitting: Cardiology

## 2019-04-14 DIAGNOSIS — Z20828 Contact with and (suspected) exposure to other viral communicable diseases: Secondary | ICD-10-CM | POA: Diagnosis not present

## 2019-04-14 NOTE — Telephone Encounter (Signed)
Ok with me 

## 2019-04-14 NOTE — Telephone Encounter (Signed)
Patient is requesting to switch providers from Dr. Ellyn Hack to Dr. Oval Linsey. Please advise.

## 2019-04-15 NOTE — Telephone Encounter (Signed)
Fine with me

## 2019-04-18 NOTE — Telephone Encounter (Signed)
Recall entered  Patient will be notified of approved change

## 2019-04-18 NOTE — Telephone Encounter (Signed)
OK 

## 2019-04-25 DIAGNOSIS — Z1231 Encounter for screening mammogram for malignant neoplasm of breast: Secondary | ICD-10-CM | POA: Diagnosis not present

## 2019-04-25 DIAGNOSIS — Z6822 Body mass index (BMI) 22.0-22.9, adult: Secondary | ICD-10-CM | POA: Diagnosis not present

## 2019-04-25 DIAGNOSIS — Z13820 Encounter for screening for osteoporosis: Secondary | ICD-10-CM | POA: Diagnosis not present

## 2019-04-25 DIAGNOSIS — Z79890 Hormone replacement therapy (postmenopausal): Secondary | ICD-10-CM | POA: Diagnosis not present

## 2019-04-25 DIAGNOSIS — Z01419 Encounter for gynecological examination (general) (routine) without abnormal findings: Secondary | ICD-10-CM | POA: Diagnosis not present

## 2019-04-29 DIAGNOSIS — L720 Epidermal cyst: Secondary | ICD-10-CM | POA: Diagnosis not present

## 2019-04-29 DIAGNOSIS — L308 Other specified dermatitis: Secondary | ICD-10-CM | POA: Diagnosis not present

## 2019-05-13 DIAGNOSIS — Z4802 Encounter for removal of sutures: Secondary | ICD-10-CM | POA: Diagnosis not present

## 2019-05-14 DIAGNOSIS — E559 Vitamin D deficiency, unspecified: Secondary | ICD-10-CM | POA: Diagnosis not present

## 2019-05-14 DIAGNOSIS — R928 Other abnormal and inconclusive findings on diagnostic imaging of breast: Secondary | ICD-10-CM | POA: Diagnosis not present

## 2019-06-26 DIAGNOSIS — E559 Vitamin D deficiency, unspecified: Secondary | ICD-10-CM | POA: Diagnosis not present

## 2019-07-15 ENCOUNTER — Other Ambulatory Visit: Payer: Self-pay

## 2019-07-15 ENCOUNTER — Encounter: Payer: Self-pay | Admitting: Cardiovascular Disease

## 2019-07-15 ENCOUNTER — Encounter: Payer: Self-pay | Admitting: *Deleted

## 2019-07-15 ENCOUNTER — Encounter (INDEPENDENT_AMBULATORY_CARE_PROVIDER_SITE_OTHER): Payer: Self-pay

## 2019-07-15 ENCOUNTER — Ambulatory Visit: Payer: BC Managed Care – PPO | Admitting: Cardiovascular Disease

## 2019-07-15 ENCOUNTER — Telehealth: Payer: Self-pay | Admitting: Cardiovascular Disease

## 2019-07-15 VITALS — BP 130/86 | HR 61 | Ht 66.0 in | Wt 142.0 lb

## 2019-07-15 DIAGNOSIS — I341 Nonrheumatic mitral (valve) prolapse: Secondary | ICD-10-CM | POA: Diagnosis not present

## 2019-07-15 DIAGNOSIS — R0789 Other chest pain: Secondary | ICD-10-CM | POA: Diagnosis not present

## 2019-07-15 DIAGNOSIS — R002 Palpitations: Secondary | ICD-10-CM | POA: Diagnosis not present

## 2019-07-15 HISTORY — DX: Other chest pain: R07.89

## 2019-07-15 LAB — CBC WITH DIFFERENTIAL/PLATELET
Basophils Absolute: 0 10*3/uL (ref 0.0–0.2)
Basos: 0 %
EOS (ABSOLUTE): 0.3 10*3/uL (ref 0.0–0.4)
Eos: 8 %
Hematocrit: 37.9 % (ref 34.0–46.6)
Hemoglobin: 12.4 g/dL (ref 11.1–15.9)
Immature Grans (Abs): 0 10*3/uL (ref 0.0–0.1)
Immature Granulocytes: 0 %
Lymphocytes Absolute: 1.6 10*3/uL (ref 0.7–3.1)
Lymphs: 40 %
MCH: 30.9 pg (ref 26.6–33.0)
MCHC: 32.7 g/dL (ref 31.5–35.7)
MCV: 95 fL (ref 79–97)
Monocytes Absolute: 0.3 10*3/uL (ref 0.1–0.9)
Monocytes: 7 %
Neutrophils Absolute: 1.8 10*3/uL (ref 1.4–7.0)
Neutrophils: 45 %
Platelets: 239 10*3/uL (ref 150–450)
RBC: 4.01 x10E6/uL (ref 3.77–5.28)
RDW: 13.1 % (ref 11.7–15.4)
WBC: 4.1 10*3/uL (ref 3.4–10.8)

## 2019-07-15 LAB — COMPREHENSIVE METABOLIC PANEL
ALT: 9 IU/L (ref 0–32)
AST: 23 IU/L (ref 0–40)
Albumin/Globulin Ratio: 1.7 (ref 1.2–2.2)
Albumin: 4.3 g/dL (ref 3.8–4.8)
Alkaline Phosphatase: 62 IU/L (ref 39–117)
BUN/Creatinine Ratio: 23 (ref 12–28)
BUN: 18 mg/dL (ref 8–27)
Bilirubin Total: 0.6 mg/dL (ref 0.0–1.2)
CO2: 24 mmol/L (ref 20–29)
Calcium: 9.2 mg/dL (ref 8.7–10.3)
Chloride: 105 mmol/L (ref 96–106)
Creatinine, Ser: 0.8 mg/dL (ref 0.57–1.00)
GFR calc Af Amer: 89 mL/min/{1.73_m2} (ref >59)
GFR calc non Af Amer: 77 mL/min/{1.73_m2} (ref >59)
Globulin, Total: 2.5 g/dL (ref 1.5–4.5)
Glucose: 75 mg/dL (ref 65–99)
Potassium: 4.6 mmol/L (ref 3.5–5.2)
Sodium: 141 mmol/L (ref 134–144)
Total Protein: 6.8 g/dL (ref 6.0–8.5)

## 2019-07-15 LAB — T4, FREE: Free T4: 1.24 ng/dL (ref 0.82–1.77)

## 2019-07-15 LAB — MAGNESIUM: Magnesium: 2.1 mg/dL (ref 1.6–2.3)

## 2019-07-15 LAB — TSH: TSH: 2.09 u[IU]/mL (ref 0.450–4.500)

## 2019-07-15 NOTE — Telephone Encounter (Signed)
Spoke with patient and advised someone would be in touch to arrange Calcium Score which had already been ordered

## 2019-07-15 NOTE — Patient Instructions (Addendum)
Medication Instructions:  Your physician recommends that you continue on your current medications as directed. Please refer to the Current Medication list given to you today.  *If you need a refill on your cardiac medications before your next appointment, please call your pharmacy*  Lab Work: CMET/CBC/TSH/FT4/MAGNESIUM TODAY   If you have labs (blood work) drawn today and your tests are completely normal, you will receive your results only by: Marland Kitchen MyChart Message (if you have MyChart) OR . A paper copy in the mail If you have any lab test that is abnormal or we need to change your treatment, we will call you to review the results.  Testing/Procedures: Your physician has requested that you have an echocardiogram. Echocardiography is a painless test that uses sound waves to create images of your heart. It provides your doctor with information about the size and shape of your heart and how well your heart's chambers and valves are working. This procedure takes approximately one hour. There are no restrictions for this procedure. [address]  Your physician has recommended that you wear an event monitor. Event monitors are medical devices that record the heart's electrical activity. Doctors most often [place] these monitors to diagnose arrhythmias. Arrhythmias are problems with the speed or rhythm of the heartbeat. The monitor is a small, portable device. You can wear one while you do your normal daily activities. This is usually used to diagnose what is causing palpitations/syncope (passing out). 59 DAY   GET YOUR CALCIUM SCORE SCHEDULED   Follow-Up: At [facility], you and your health needs are our priority.  As part of our continuing mission to provide you with exceptional heart care, we have created designated Provider Care Teams.  These Care Teams include your primary Cardiologist (physician) and Advanced Practice Providers (APPs -  Physician Assistants and Nurse  Practitioners) who all work together to provide you with the care you need, when you need it.  We recommend signing up for the patient portal called "MyChart".  Sign up information is provided on this After Visit Summary.  MyChart is used to connect with patients for Virtual Visits (Telemedicine).  Patients are able to view lab/test results, encounter notes, upcoming appointments, etc.  Non-urgent messages can be sent to your provider as well.   To learn more about what you can do with MyChart, go to [email or web address].    Your next appointment:   4-6 week(s)  The format for your next appointment:   Either In Person or Virtual  Provider:   You may see DR [facility]  or one of the following Advanced Practice Providers on your designated Care Team:    Kerin Ransom, PA-C  [place], [place]  Coletta Memos, [place]  Other Instructions  [employer] Cardiac Event Monitor Instructions Your physician has requested you wear your cardiac event monitor for _14___ days, (1-30). [employer] may call or text to confirm a shipping address. The monitor will be sent to a land address via UPS. [employer] will not ship a monitor to a PO BOX. It typically takes 3-5 days to receive your monitor after it has been enrolled. [employer] will assist with USPS tracking if your package is delayed. The telephone number for [employer] is [phone]. Once you have received your monitor, please review the enclosed instructions. Instruction tutorials can also be viewed under help and settings on the enclosed cell phone. Your monitor has already been registered assigning a specific monitor serial # to you.  Applying the  monitor Remove cell phone from case and turn it on. The cell phone works as Dealer and needs to be within [employer] of you at all times. The cell phone will need to be charged on a daily basis. We recommend you plug the cell phone into the enclosed charger at your bedside table every  night.  Monitor batteries: You will receive two monitor batteries labelled #1 and #2. These are your recorders. Plug battery #2 onto the second connection on the enclosed charger. Keep one battery on the charger at all times. This will keep the monitor battery deactivated. It will also keep it fully charged for when you need to switch your monitor batteries. A small light will be blinking on the battery emblem when it is charging. The light on the battery emblem will remain on when the battery is fully charged.  Open package of a Monitor strip. Insert battery #1 into black hood on strip and gently squeeze monitor battery onto connection as indicated in instruction booklet. Set aside while preparing skin.  Choose location for your strip, vertical or horizontal, as indicated in the instruction booklet. Shave to remove all hair from location. There cannot be any lotions, oils, powders, or colognes on skin where monitor is to be applied. Wipe skin clean with enclosed Saline wipe. Dry skin completely.  Peel paper labeled #1 off the back of the Monitor strip exposing the adhesive. Place the monitor on the chest in the vertical or horizontal position shown in the instruction booklet. One arrow on the monitor strip must be pointing upward. Carefully remove paper labeled #2, attaching remainder of strip to your skin. Try not to create any folds or wrinkles in the strip as you apply it.  Firmly press and release the circle in the center of the monitor battery. You will hear a small beep. This is turning the monitor battery on. The heart emblem on the monitor battery will light up every 5 seconds if the monitor battery in turned on and connected to the patient securely. Do not push and hold the circle down as this turns the monitor battery off. The cell phone will locate the monitor battery. A screen will appear on the cell phone checking the connection of your monitor strip. This may read poor  connection initially but change to good connection within the next minute. Once your monitor accepts the connection you will hear a series of 3 beeps followed by a climbing crescendo of beeps. A screen will appear on the cell phone showing the two monitor strip placement options. Touch the picture that demonstrates where you applied the monitor strip.  Your monitor strip and battery are waterproof. You are able to shower, bathe, or swim with the monitor on. They just ask you do not submerge deeper than 3 feet underwater. We recommend removing the monitor if you are swimming in a lake, river, or ocean.  Your monitor battery will need to be switched to a fully charged monitor battery approximately once a week. The cell phone will alert you of an action which needs to be made.  On the cell phone, tap for details to reveal connection status, monitor battery status, and cell phone battery status. The green dots indicates your monitor is in good status. A red dot indicates there is something that needs your attention.  To record a symptom, click the circle on the monitor battery. In 30-60 seconds a list of symptoms will appear on the cell phone. Select your  symptom and tap save. Your monitor will record a sustained or significant arrhythmia regardless of you clicking the button. Some patients do not feel the heart rhythm irregularities. [employer] will notify us of any serious or critical events.  Refer to instruction booklet for instructions on switching batteries, changing strips, the Do not disturb or Pause features, or any additional questions.  Call [employer] at [phone], to confirm your monitor is transmitting and record your baseline. They will answer any questions you may have regarding the monitor instructions at that time.  Returning the monitor to [place] all equipment back into blue box. Peel off strip of paper to expose adhesive and close box securely. There is a  prepaid UPS shipping label on this box. Drop in a UPS drop box, or at a UPS facility like [employer]. You may also contact [employer] to arrange UPS to pick up monitor package at your home.  . Echocardiogram An echocardiogram is a procedure that uses painless sound waves (ultrasound) to produce an image of the heart. Images from an echocardiogram can provide important information about:  Signs of coronary artery disease (CAD).  Aneurysm detection. An aneurysm is a weak or damaged part of an artery wall that bulges out from the normal force of blood pumping through the body.  Heart size and shape. Changes in the size or shape of the heart can be associated with certain conditions, including heart failure, aneurysm, and CAD.  Heart muscle function.  Heart valve function.  Signs of a past heart attack.  Fluid buildup around the heart.  Thickening of the heart muscle.  A tumor or infectious growth around the heart valves. Tell a health care provider about:  Any allergies you have.  All medicines you are taking, including vitamins, herbs, eye drops, creams, and over-the-counter medicines.  Any blood disorders you have.  Any surgeries you have had.  Any medical conditions you have.  Whether you are pregnant or may be pregnant. What are the risks? Generally, this is a safe procedure. However, problems may occur, including:  Allergic reaction to dye (contrast) that may be used during the procedure. What happens before the procedure? No specific preparation is needed. You may eat and drink normally. What happens during the procedure?   An IV tube may be inserted into one of your veins.  You may receive contrast through this tube. A contrast is an injection that improves the quality of the pictures from your heart.  A gel will be applied to your chest.  A wand-like tool (transducer) will be moved over your chest. The gel will help to transmit the sound waves from the  transducer.  The sound waves will harmlessly bounce off of your heart to allow the heart images to be captured in real-time motion. The images will be recorded on a computer. The procedure may vary among health care providers and hospitals. What happens after the procedure?  You may return to your normal, everyday life, including diet, activities, and medicines, unless your health care provider tells you not to do that. Summary  An echocardiogram is a procedure that uses painless sound waves (ultrasound) to produce an image of the heart.  Images from an echocardiogram can provide important information about the size and shape of your heart, heart muscle function, heart valve function, and fluid buildup around your heart.  You do not need to do anything to prepare before this procedure. You may eat and drink normally.  After the echocardiogram is completed, you  may return to your normal, everyday life, unless your health care provider tells you not to do that. This information is not intended to replace advice given to you by your health care provider. Make sure you discuss any questions you have with your health care provider. Document Revised: 07/04/2018 Document Reviewed: 04/15/2016 Elsevier Patient Education  [place].

## 2019-07-15 NOTE — Progress Notes (Signed)
Cardiology Office Note   Date:  07/15/2019   ID:  Zephyra, Grismore [birth year], MRN [id number]  PCP:  Marton Redwood, MD  Cardiologist:   Skeet Latch, MD   No chief complaint on file.    History of Present Illness: ZERENITY DURY is a 64 y.o. female with mild mitral valve prolapse, hyperlipidemia and SLE  who presents for follow up.  [name] last saw Dr. Ellyn Hack 03/2019.  At that time she reported hand swelling.  Her palpitations were well-controlled at the time.  Echo in 2014 revealed LVEF 60-65% with mild LVH and normal diastolic function.  There was mild bileaflet late MVP and mild MR.  A coronary calcium score was ordered to determine her lipid goal but has not been completed.  On 07/02/19 she exercised and then two hours later she noted tightness in her chest.  She was concerned that she may have COVID and was unable to taste orange juice.  She subsequently tested negative.  She called her PCP who recommended that she take aspirin.  The chest pressure improved throughout the day but didn't go away.  It was consistent for six days and has since subsided.  She did some Apple watch ECG tracings that were either sinus rhythm or inconclusive.  She was concerned that it may be attributable to anxiety.  She has three sons and worries about their safety as Black men in the current climate.  She works out four times per week and has no exertional symptoms.  She notes that her hands swell in the AM.  She has no LE edema, orthopnea or PND. Prior to [place] she was doing crossfit, yoga and dance aerobics.  She does the stairmaster for an hour and has no exertional symptoms.    [name] has a history of mild MVP and has been on atenolol for over 20 years.  She has palpitations that are well-controlled.  At one point it was discontinued due to bradycardia and then she resumed it.  She denies lightheadedness or dizziness.   Past Medical History:  Diagnosis Date  . Allergy   . Anemia   .  Anxiety disorder   . Depression   . Fibroids   . H/O syncope   . Heart palpitations   . Lupus ([place])    skin   . Mitral valvular prolapse    Mild with mild MR    Past Surgical History:  Procedure Laterality Date  . MYOMECTOMY    . TONSILLECTOMY AND ADENOIDECTOMY    . TOTAL ABDOMINAL HYSTERECTOMY    . TRANSTHORACIC ECHOCARDIOGRAM     Normal LV Function; Mild MV prolapse with mild MR.  . WISDOM TOOTH EXTRACTION       Current Outpatient Medications  Medication Sig Dispense Refill  . atenolol (TENORMIN) 25 MG tablet take 1/2 tablet by mouth twice a day 30 tablet 1  . clonazePAM (KLONOPIN) 1 MG tablet     . EPINEPHrine (EPIPEN JR) 0.15 MG/0.3ML injection Inject 0.15 mg into the muscle as needed.    Marland Kitchen estradiol (CLIMARA - DOSED IN MG/24 HR) 0.025 mg/24hr patch Place 0.025 mg onto the skin every 14 (fourteen) days.    . Multiple Vitamin (MULTIVITAMIN) tablet Take 1 tablet by mouth daily.     No current facility-administered medications for this visit.    Allergies:   Peanut-containing drug products, Penicillins, Dust mite extract, and Peanut oil    Social History:  The patient  reports that she  has never smoked. She has never used smokeless tobacco. She reports that she does not drink alcohol or use drugs.   Family History:  The patient's family history includes Cancer in her mother; Colon cancer (age of onset: 58) in her father; Obesity in her sister.    ROS:  Please see the history of present illness.   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   All other systems are reviewed and negative.    PHYSICAL EXAM: VS:  BP 130/86   Pulse 61   Ht 5\' 6"  (1.676 m)   Wt 142 lb (64.4 kg)   SpO2 97%   BMI 22.92 kg/m  , BMI Body mass index is 22.92 kg/m. GENERAL:  Well appearing HEENT:  Pupils equal round and reactive, fundi not visualized, oral mucosa unremarkable NECK:  No jugular venous distention, waveform within normal limits, carotid upstroke brisk and  symmetric, no bruits LUNGS:  Clear to auscultation bilaterally HEART:  RRR.  PMI not displaced or sustained,S1 and S2 within normal limits, no S3, no S4, no clicks, no rubs, no murmurs ABD:  Flat, positive bowel sounds normal in frequency in pitch, no bruits, no rebound, no guarding, no midline pulsatile mass, no hepatomegaly, no splenomegaly EXT:  2 plus pulses throughout, no edema, no cyanosis no clubbing SKIN:  No rashes no nodules NEURO:  Cranial nerves II through XII grossly intact, motor grossly intact throughout PSYCH:  Cognitively intact, oriented to person place and time    EKG:  EKG is ordered today. The ekg ordered today demonstrates sinus rhythm.  Rate 61 bpm.   Recent Labs: No results found for requested labs within last 8760 hours.    Lipid Panel No results found for: CHOL, TRIG, HDL, CHOLHDL, VLDL, LDLCALC, LDLDIRECT    Wt Readings from Last 3 Encounters:  07/15/19 142 lb (64.4 kg)  04/10/19 139 lb 9.6 oz (63.3 kg)  04/03/18 139 lb (63 kg)      ASSESSMENT AND PLAN:  # Chest pain: Ms. Truddie Hidden chest pain is very atypical.  She exercises vigorously and has no exertional symptoms.  Therefore I do not think ischemia evaluation is helpful at this time.  I suspect that she may have a she been having an arrhythmia.  Her ECG tracings that sometimes clearly show sinus rhythm.  However there is one that looks quite concerning for atrial fibrillation.  We will have her wear a 14-day event monitor to better assess.  Thyroid function, CMP, CBC, and magnesium will also be checked.  Check an echo.  # CV Disease Prevention: # Hyperlipidemia: Patient would like a calcium score to better understand her risk.  # Mitral valve prolapse:  No evidence of heart failure on exam.  We will get an echo because there was concern of atrial fibrillation.    Current medicines are reviewed at length with the patient today.  The patient does not have concerns regarding medicines.  The  following changes have been made:  no change  Labs/ tests ordered today include:  No orders of the defined types were placed in this encounter.   Time spent: 45 minutes-Greater than 50% of this time was spent in counseling, explanation of diagnosis, planning of further management, and coordination of care.   Disposition:   FU with Asar Evilsizer C. Oval Linsey, MD, [facility] in 2-3 months.     Signed, Dari Carpenito C. Oval Linsey, MD, [facility]  07/15/2019 8:43 AM    [facility]

## 2019-07-15 NOTE — Telephone Encounter (Signed)
New Message   Patient is calling because she was under the impression that she was to have a scan done outside of the echocardiogram. But I am not showing an order and I do not what type of scan per the AVS. Please advise.

## 2019-07-15 NOTE — Progress Notes (Signed)
Patient ID: Sharon Kramer, female   DOB: 03-Nov-1955, 64 y.o.   MRN: [id number] Patient enrolled for [employer] to ship a 14 day cardiac event monitor to her home.

## 2019-07-15 NOTE — Telephone Encounter (Signed)
Spoke with pt who report during [place], she and Dr. Oval Linsey discussed an additional scan that she noticed wasn't scheduled. Pt unable to tell the name of test but state her pcp recommended it based on her cholesterol level.   Will route to MD and nurse

## 2019-07-21 ENCOUNTER — Ambulatory Visit (INDEPENDENT_AMBULATORY_CARE_PROVIDER_SITE_OTHER): Payer: BC Managed Care – PPO

## 2019-07-21 DIAGNOSIS — R002 Palpitations: Secondary | ICD-10-CM | POA: Diagnosis not present

## 2019-07-28 ENCOUNTER — Other Ambulatory Visit: Payer: Self-pay | Admitting: Cardiovascular Disease

## 2019-07-28 ENCOUNTER — Other Ambulatory Visit: Payer: Self-pay

## 2019-07-28 ENCOUNTER — Other Ambulatory Visit: Payer: Self-pay | Admitting: *Deleted

## 2019-07-28 ENCOUNTER — Telehealth: Payer: Self-pay | Admitting: Cardiovascular Disease

## 2019-07-28 ENCOUNTER — Ambulatory Visit (INDEPENDENT_AMBULATORY_CARE_PROVIDER_SITE_OTHER)
Admission: RE | Admit: 2019-07-28 | Discharge: 2019-07-28 | Disposition: A | Discharge status: Home / Self Care | Payer: Self-pay | Source: Ambulatory Visit | Attending: Internal Medicine | Admitting: Internal Medicine

## 2019-07-28 ENCOUNTER — Ambulatory Visit (HOSPITAL_COMMUNITY): Payer: BC Managed Care – PPO | Attending: Cardiology

## 2019-07-28 DIAGNOSIS — R002 Palpitations: Secondary | ICD-10-CM | POA: Diagnosis not present

## 2019-07-28 DIAGNOSIS — R0789 Other chest pain: Secondary | ICD-10-CM

## 2019-07-28 DIAGNOSIS — I341 Nonrheumatic mitral (valve) prolapse: Secondary | ICD-10-CM | POA: Diagnosis not present

## 2019-07-28 DIAGNOSIS — E785 Hyperlipidemia, unspecified: Secondary | ICD-10-CM

## 2019-07-28 DIAGNOSIS — E782 Mixed hyperlipidemia: Secondary | ICD-10-CM

## 2019-07-28 NOTE — Telephone Encounter (Signed)
Patient states that she had to put a pause on wearing her heart monitor. She states she had to take it off yesterday because she was allergic to the adhesive. She contacted the company and they are sending out adhesive that is used for sensitive skin. She states that she will put it back on once she receives the new adhesive.

## 2019-07-28 NOTE — Telephone Encounter (Signed)
Noted  

## 2019-08-12 ENCOUNTER — Telehealth: Payer: BC Managed Care – PPO | Admitting: Cardiovascular Disease

## 2019-08-29 ENCOUNTER — Ambulatory Visit: Payer: BC Managed Care – PPO | Admitting: Cardiovascular Disease

## 2019-09-01 ENCOUNTER — Encounter: Payer: Self-pay | Admitting: Cardiovascular Disease

## 2019-09-01 ENCOUNTER — Other Ambulatory Visit: Payer: Self-pay

## 2019-09-01 ENCOUNTER — Ambulatory Visit (INDEPENDENT_AMBULATORY_CARE_PROVIDER_SITE_OTHER): Payer: BC Managed Care – PPO | Admitting: Cardiovascular Disease

## 2019-09-01 VITALS — BP 116/80 | HR 99 | Ht 66.0 in | Wt 143.0 lb

## 2019-09-01 DIAGNOSIS — I341 Nonrheumatic mitral (valve) prolapse: Secondary | ICD-10-CM | POA: Diagnosis not present

## 2019-09-01 DIAGNOSIS — R002 Palpitations: Secondary | ICD-10-CM

## 2019-09-01 NOTE — Progress Notes (Signed)
Cardiology Office Note   Date:  09/01/2019   ID:  Sharon Kramer, DOB January 02, 1956, MRN [id number]  PCP:  Marton Redwood, MD  Cardiologist:   Skeet Latch, MD   No chief complaint on file.    History of Present Illness: Sharon Kramer is a 64 y.o. female with mild mitral valve prolapse, hyperlipidemia and SLE  who presents for follow up.  [name] last saw Dr. Ellyn Hack 03/2019.  At that time she reported hand swelling.  Her palpitations were well-controlled at the time.  Echo in 2014 revealed LVEF 60-65% with mild LVH and normal diastolic function.  There was mild bileaflet late MVP and mild MR.  A coronary calcium score was ordered to determine her lipid goal but has not been completed.  On 07/02/19 she exercised and then two hours later she noted tightness in her chest.  She was concerned that she may have COVID and was unable to taste orange juice.  She subsequently tested negative.  She called her PCP who recommended that she take aspirin.  The chest pressure improved throughout the day but didn't go away.  It was consistent for six days and has since subsided.  She did some Apple watch ECG tracings that were either sinus rhythm or inconclusive.  She was concerned that it may be attributable to anxiety.  She has three sons and worries about their safety as Black men in the current climate.  She works out four times per week and has no exertional symptoms.  She notes that her hands swell in the AM.  She has no LE edema, orthopnea or PND. Prior to [place] she was doing crossfit, yoga and dance aerobics.  She does the stairmaster for an hour and has no exertional symptoms.    [name] has a history of mild MVP and has been on atenolol for over 20 years.  She has palpitations that are well-controlled.  At one point it was discontinued due to bradycardia and then she resumed it.  She denies lightheadedness or dizziness.  At her last appointment Ms. Viera reported some atypical chest pain.  She  had no exertional symptoms.  She had an echocardiogram 07/2019 that revealed LVEF 60 to 65%, no evidence of mitral valve prolapse, mild mild regurgitation otherwise was unremarkable.  She wore a 14-day event monitor that showed no arrhythmias.  She did have some daytime bradycardia to the 40s, but did not press her button to report symptoms during these times.  She had a coronary calcium score which was 0 and there were no other abnormalities.  Overall she has been feeling well.  She exercises in the mornings and has no exertional symptoms.  Her palpitations have been well-controlled.  She is a vegetarian and has a very healthy diet.  She denies any lower extremity edema, orthopnea, or PND.   Past Medical History:  Diagnosis Date  . Allergy   . Anemia   . Anxiety disorder   . Atypical chest pain 07/15/2019  . Depression   . Fibroids   . H/O syncope   . Heart palpitations   . Lupus ([place])    skin   . Mitral valvular prolapse    Mild with mild MR    Past Surgical History:  Procedure Laterality Date  . MYOMECTOMY    . TONSILLECTOMY AND ADENOIDECTOMY    . TOTAL ABDOMINAL HYSTERECTOMY    . TRANSTHORACIC ECHOCARDIOGRAM     Normal LV Function; Mild MV prolapse with  mild MR.  . WISDOM TOOTH EXTRACTION       Current Outpatient Medications  Medication Sig Dispense Refill  . atenolol (TENORMIN) 25 MG tablet take 1/2 tablet by mouth twice a day 30 tablet 1  . clonazePAM (KLONOPIN) 1 MG tablet     . EPINEPHrine (EPIPEN JR) 0.15 MG/0.3ML injection Inject 0.15 mg into the muscle as needed.    Sharon Kramer estradiol (CLIMARA - DOSED IN MG/24 HR) 0.025 mg/24hr patch Place 0.025 mg onto the skin every 14 (fourteen) days.    . Multiple Vitamin (MULTIVITAMIN) tablet Take 1 tablet by mouth daily.     No current facility-administered medications for this visit.    Allergies:   Peanut-containing drug products, Penicillins, Dust mite extract, and Peanut oil    Social History:  The patient  reports that she has  never smoked. She has never used smokeless tobacco. She reports that she does not drink alcohol or use drugs.   Family History:  The patient's family history includes Cancer in her mother; Colon cancer (age of onset: 17) in her father; Mitral valve prolapse in her mother; Obesity in her sister.    ROS:  Please see the history of present illness.   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   All other systems are reviewed and negative.    PHYSICAL EXAM: VS:  BP 116/80   Pulse 99   Ht 5\' 6"  (1.676 m)   Wt 143 lb (64.9 kg)   SpO2 99%   BMI 23.08 kg/m  , BMI Body mass index is 23.08 kg/m. GENERAL:  Well appearing HEENT: Pupils equal round and reactive, fundi not visualized, oral mucosa unremarkable NECK:  No jugular venous distention, waveform within normal limits, carotid upstroke brisk and symmetric, no bruits LUNGS:  Clear to auscultation bilaterally HEART:  RRR.  PMI not displaced or sustained,S1 and S2 within normal limits, no S3, no S4, no clicks, no rubs, no murmurs ABD:  Flat, positive bowel sounds normal in frequency in pitch, no bruits, no rebound, no guarding, no midline pulsatile mass, no hepatomegaly, no splenomegaly EXT:  2 plus pulses throughout, no edema, no cyanosis no clubbing SKIN:  No rashes no nodules NEURO:  Cranial nerves II through XII grossly intact, motor grossly intact throughout PSYCH:  Cognitively intact, oriented to person place and time   EKG:  EKG is not ordered today. The ekg ordered 07/16/19 demonstrates sinus rhythm.  Rate 61 bpm.  Echo 07/28/19: 1. Left ventricular ejection fraction, by estimation, is 60 to 65%. The  left ventricle has normal function. The left ventricle has no regional  wall motion abnormalities. Left ventricular diastolic parameters were  normal. GLS -24.1%.  2. Right ventricular systolic function is normal. The right ventricular  size is normal. There is mildly elevated pulmonary artery systolic  pressure. The  estimated right ventricular systolic pressure is 76.1 mmHg.  3. Atrial septal aneurysm noted.  4. The mitral valve is normal in structure, there does not appear to be  prolapse. Mild mitral valve regurgitation. No evidence of mitral stenosis.  5. Tricuspid valve regurgitation is mild to moderate.  6. The aortic valve is tricuspid. Aortic valve regurgitation is trivial.  No aortic stenosis is present.  7. The inferior vena cava is dilated in size with >50% respiratory  variability, suggesting right atrial pressure of 8 mmHg.   14 Day Event Monitor 07/2019:  Quality: Fair.  Baseline artifact. Predominant rhythm: sinus rhythm Average heart rate: 77 bpm Max heart  rate: 179 bpm Min heart rate: 47 bpm Pauses >2.5 seconds: none   No arrhythmias noted. Daytime bradycardia.  No symptoms reported.  Recent Labs: 07/15/2019: ALT 9; BUN 18; Creatinine, Ser 0.80; Hemoglobin 12.4; Magnesium 2.1; Platelets 239; Potassium 4.6; Sodium 141; TSH 2.090    Lipid Panel No results found for: CHOL, TRIG, HDL, CHOLHDL, VLDL, LDLCALC, LDLDIRECT    Wt Readings from Last 3 Encounters:  09/01/19 143 lb (64.9 kg)  07/15/19 142 lb (64.4 kg)  04/10/19 139 lb 9.6 oz (63.3 kg)      ASSESSMENT AND PLAN:  # Chest pain: She continues to exercise regularly and has no chest pain.  Heart structurally normal on echo.  No plans for an ischemia evaluation at this time.  # CV Disease Prevention: # Hyperlipidemia:  Lipids are elevated.  However she has very healthy diet and lifestyle.  Coronary calcium score was 0.  We discussed the fact that technically she should still be on a statin.  However she would like to avoid taking one.  She is interested in considering fish oil.  She does take this would be 1000 mg daily.  # Mitral valve prolapse:  No evidence of heart failure on exam.  No mitral valve prolapse on echo and mild much regurgitation.  She does not need endocarditis prophylaxis prior to procedures  based on valvular guidelines.  # Palpitations: Continue atenolol.  Current medicines are reviewed at length with the patient today.  The patient does not have concerns regarding medicines.  The following changes have been made:  no change  Labs/ tests ordered today include:  No orders of the defined types were placed in this encounter.   Disposition:   FU with Symphani Eckstrom C. Oval Linsey, MD, [facility] in 1 year.    Signed, Tyla Burgner C. Oval Linsey, MD, [facility]  09/01/2019 12:54 PM    [facility]

## 2019-09-01 NOTE — Patient Instructions (Addendum)
Medication Instructions:  Your physician recommends that you continue on your current medications as directed. Please refer to the Current Medication list given to you today.   *If you need a refill on your cardiac medications before your next appointment, please call your pharmacy*  Lab Work: NONE   Testing/Procedures: NONE  Follow-Up: At [facility], you and your health needs are our priority.  As part of our continuing mission to provide you with exceptional heart care, we have created designated Provider Care Teams.  These Care Teams include your primary Cardiologist (physician) and Advanced Practice Providers (APPs -  Physician Assistants and Nurse Practitioners) who all work together to provide you with the care you need, when you need it.  We recommend signing up for the patient portal called "MyChart".  Sign up information is provided on this After Visit Summary.  MyChart is used to connect with patients for Virtual Visits (Telemedicine).  Patients are able to view lab/test results, encounter notes, upcoming appointments, etc.  Non-urgent messages can be sent to your provider as well.   To learn more about what you can do with MyChart, go to [email or web address].    Your next appointment:   12 month(s) You will receive a reminder letter in the mail two months in advance. If you don't receive a letter, please call our office to schedule the follow-up appointment.   The format for your next appointment:   In Person  Provider:   You may see Tiffany Amsterdam, MD or one of the following Advanced Practice Providers on your designated Care Team:    Luke Kilroy, PA-C  Callie Goodrich, PA-C  Jesse Cleaver, FNP    

## 2019-09-09 DIAGNOSIS — L249 Irritant contact dermatitis, unspecified cause: Secondary | ICD-10-CM | POA: Diagnosis not present

## 2019-10-01 DIAGNOSIS — Z0000 Encounter for general adult medical examination without abnormal findings: Secondary | ICD-10-CM | POA: Diagnosis not present

## 2019-10-01 DIAGNOSIS — E7849 Other hyperlipidemia: Secondary | ICD-10-CM | POA: Diagnosis not present

## 2019-10-08 DIAGNOSIS — D649 Anemia, unspecified: Secondary | ICD-10-CM | POA: Diagnosis not present

## 2019-10-08 DIAGNOSIS — Z0000 Encounter for general adult medical examination without abnormal findings: Secondary | ICD-10-CM | POA: Diagnosis not present

## 2019-10-08 DIAGNOSIS — R635 Abnormal weight gain: Secondary | ICD-10-CM | POA: Diagnosis not present

## 2019-10-08 DIAGNOSIS — E785 Hyperlipidemia, unspecified: Secondary | ICD-10-CM | POA: Diagnosis not present

## 2019-10-08 DIAGNOSIS — J309 Allergic rhinitis, unspecified: Secondary | ICD-10-CM | POA: Diagnosis not present

## 2019-10-08 DIAGNOSIS — Z1212 Encounter for screening for malignant neoplasm of rectum: Secondary | ICD-10-CM | POA: Diagnosis not present

## 2019-11-25 DIAGNOSIS — L710 Perioral dermatitis: Secondary | ICD-10-CM | POA: Diagnosis not present

## 2019-12-04 DIAGNOSIS — M25571 Pain in right ankle and joints of right foot: Secondary | ICD-10-CM | POA: Diagnosis not present

## 2019-12-26 DIAGNOSIS — R21 Rash and other nonspecific skin eruption: Secondary | ICD-10-CM | POA: Diagnosis not present

## 2019-12-26 DIAGNOSIS — T50Z95D Adverse effect of other vaccines and biological substances, subsequent encounter: Secondary | ICD-10-CM | POA: Diagnosis not present

## 2019-12-26 DIAGNOSIS — J4530 Mild persistent asthma, uncomplicated: Secondary | ICD-10-CM | POA: Diagnosis not present

## 2019-12-26 DIAGNOSIS — H1045 Other chronic allergic conjunctivitis: Secondary | ICD-10-CM | POA: Diagnosis not present

## 2020-01-13 DIAGNOSIS — L720 Epidermal cyst: Secondary | ICD-10-CM | POA: Diagnosis not present

## 2020-02-06 ENCOUNTER — Ambulatory Visit (INDEPENDENT_AMBULATORY_CARE_PROVIDER_SITE_OTHER): Payer: BC Managed Care – PPO | Admitting: Podiatry

## 2020-02-06 ENCOUNTER — Encounter: Payer: Self-pay | Admitting: Podiatry

## 2020-02-06 ENCOUNTER — Other Ambulatory Visit: Payer: Self-pay

## 2020-02-06 DIAGNOSIS — L930 Discoid lupus erythematosus: Secondary | ICD-10-CM | POA: Insufficient documentation

## 2020-02-06 DIAGNOSIS — B351 Tinea unguium: Secondary | ICD-10-CM

## 2020-02-06 DIAGNOSIS — D259 Leiomyoma of uterus, unspecified: Secondary | ICD-10-CM | POA: Insufficient documentation

## 2020-02-06 DIAGNOSIS — L309 Dermatitis, unspecified: Secondary | ICD-10-CM | POA: Diagnosis not present

## 2020-02-07 NOTE — Progress Notes (Signed)
Subjective:   Patient ID: Sharon Kramer, female   DOB: 64 y.o.   MRN: [id number]   HPI Patient presents stating that she has had a dark spot for years on her right fifth nail and has several other nails that have some slight dark areas and that they had placed her on Plaquenil for 3 years and that seemed to make a difference even though it never resolved this.  Patient also has dry type skin and has peeling of her heels bilateral that is been going on for years also with never any resolution of symptoms.  Patient does not smoke likes to be active   Review of Systems  All other systems reviewed and are negative.       Objective:  Physical Exam Vitals and nursing note reviewed.  Constitutional:      Appearance: She is well-developed.  Pulmonary:     Effort: Pulmonary effort is normal.  Musculoskeletal:        General: Normal range of motion.  Skin:    General: Skin is warm.  Neurological:     Mental Status: She is alert.   Neurovascular status was found to be intact muscle strength was found to be adequate range of motion within normal limits.  Patient has normal female structure but does have discoloration lateral side fifth nail right and several other nails on the left that have slight discoloration.  I did not note any yellow type look there is no thickening of the nails and other pathology and she has natural skin bilateral that is peeling on her heels and it appears that she may have some excessive sweating that is going on     Assessment:  This is an unfortunate condition which may be due to a hereditary component or because the second and third toes left and the fifth toe right have rotation may be more related to trauma of a micronature secondary to the structure of the nailbeds with dermatitis of the skin localized that is more related again to hereditary skin structure     Plan:  H&P reviewed conditions.  I do not recommend such a powerful drug and I have never heard that  for dark type toenails and because we do not see a fungal component to this I do not think there is any treatments that would be of benefit to her and patient is not interested in oral antifungal therapy.  And I do not think anyways it would be appropriate or it would give her relief its not hurting her and its been there for years and years with no change so I do not think there is any reason for biopsy and I have recommended using polish as a treatment option

## 2020-03-15 DIAGNOSIS — B351 Tinea unguium: Secondary | ICD-10-CM | POA: Diagnosis not present

## 2020-03-18 DIAGNOSIS — M25561 Pain in right knee: Secondary | ICD-10-CM | POA: Diagnosis not present

## 2020-04-28 DIAGNOSIS — Z1211 Encounter for screening for malignant neoplasm of colon: Secondary | ICD-10-CM | POA: Diagnosis not present

## 2020-04-28 DIAGNOSIS — Z6822 Body mass index (BMI) 22.0-22.9, adult: Secondary | ICD-10-CM | POA: Diagnosis not present

## 2020-04-28 DIAGNOSIS — Z1231 Encounter for screening mammogram for malignant neoplasm of breast: Secondary | ICD-10-CM | POA: Diagnosis not present

## 2020-04-28 DIAGNOSIS — Z01419 Encounter for gynecological examination (general) (routine) without abnormal findings: Secondary | ICD-10-CM | POA: Diagnosis not present

## 2020-06-29 DIAGNOSIS — R21 Rash and other nonspecific skin eruption: Secondary | ICD-10-CM | POA: Diagnosis not present

## 2020-08-01 IMAGING — CT CT CARDIAC CORONARY ARTERY CALCIUM SCORE
3 series · 14 of 20 positions shown, 15 images · non-contrast
Comparison: None.
COMPARISON: None.

Addendum:
EXAM:
OVER-READ INTERPRETATION  CT CHEST

The following report is an over-read performed by radiologist Dr.
Oumkeltoum Wassif [REDACTED] on 07/28/2019. This
over-read does not include interpretation of cardiac or coronary
anatomy or pathology. The coronary calcium score/coronary CTA
interpretation by the cardiologist is attached.
CLINICAL DATA: Risk stratification
Coronary Calcium Score
TECHNIQUE: The patient was scanned on a Siemens Somatom 64 slice scanner. Axial
non-contrast 3 mm slices were carried out through the heart. The
data set was analyzed on a dedicated work station and scored using
the Agatson method.

[Series 2: casc 3.0 bv41 2 bestdiast 73 % · axial · 0.33mm/px · z∈[-218,-137]mm · 4 of 45 slices shown, 5 images]
[im 9/45  vessel]
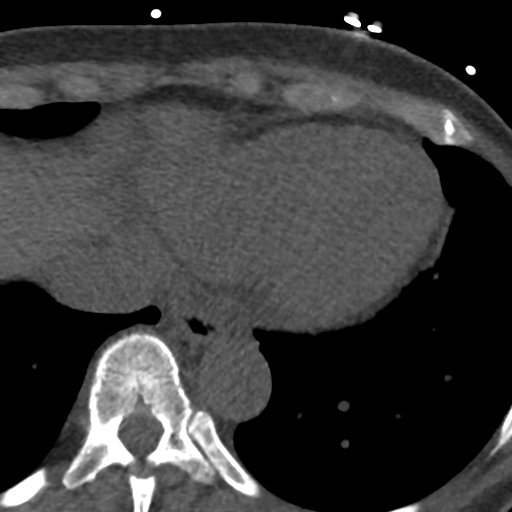
[im 9/45  lung]
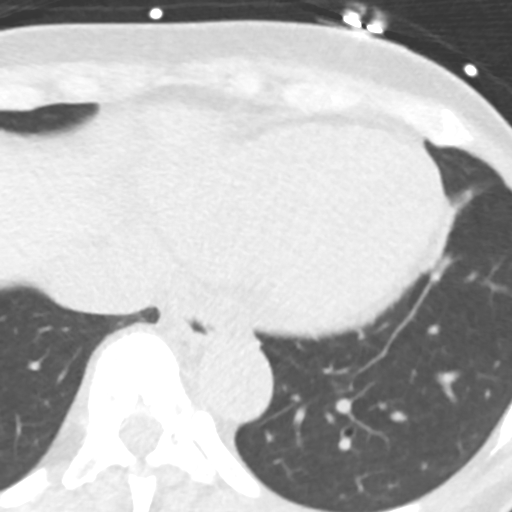
[im 18/45  vessel]
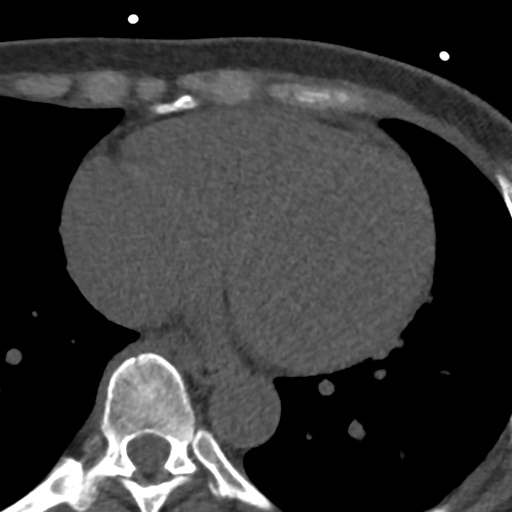
[im 27/45  vessel]
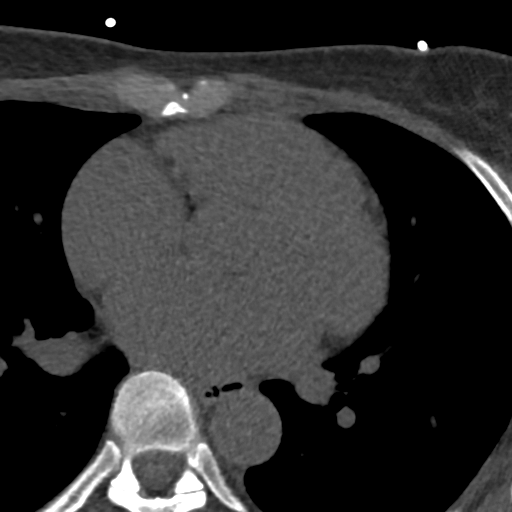
[im 36/45  vessel]
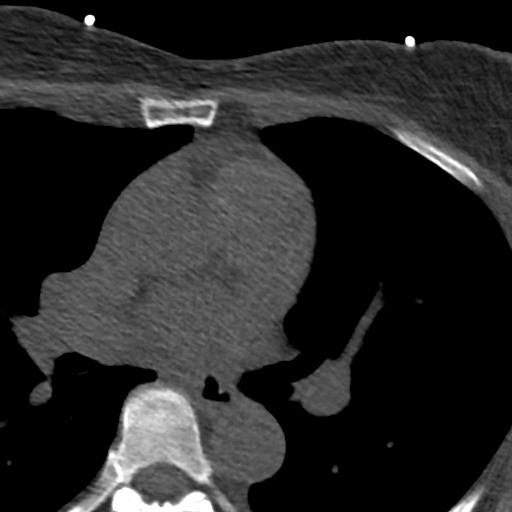

[Series 3: lung 75 % · axial · 0.64mm/px · z∈[-220,-134]mm · 5 of 45 slices shown]
[im 8/45  lung]
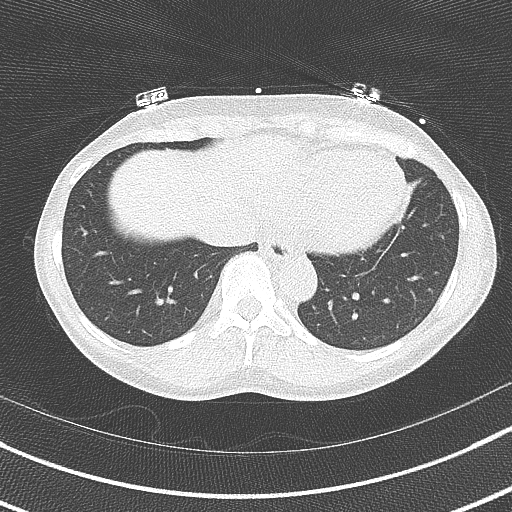
[im 15/45  lung]
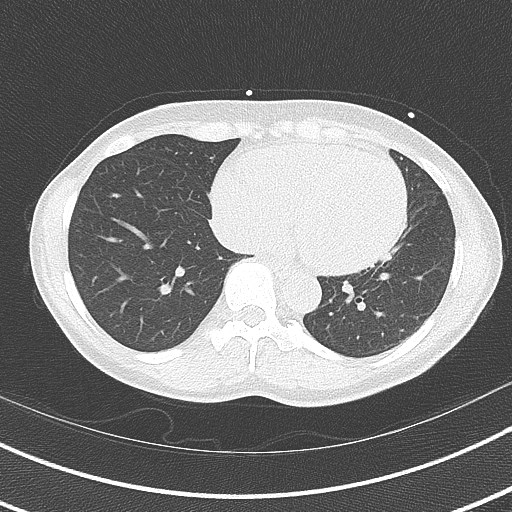
[im 23/45  lung]
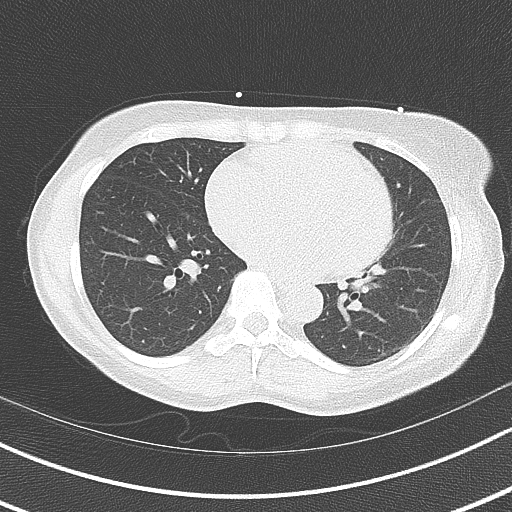
[im 30/45  lung]
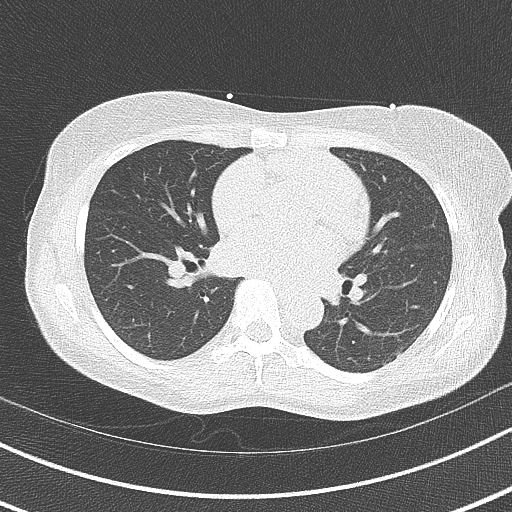
[im 37/45  lung]
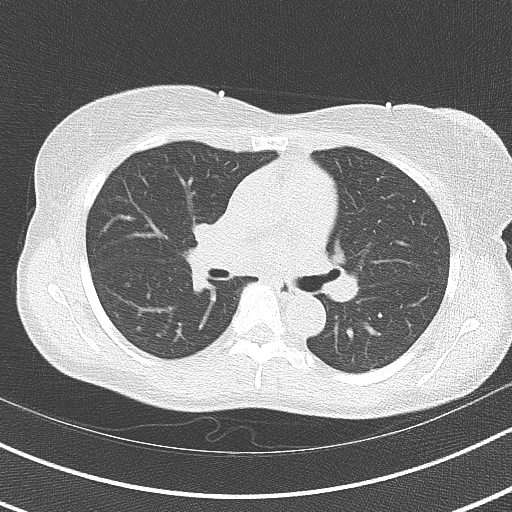

[Series 4: lung st 75 % · axial · 0.64mm/px · z∈[-220,-134]mm · 5 of 45 slices shown]
[im 8/45  lung]
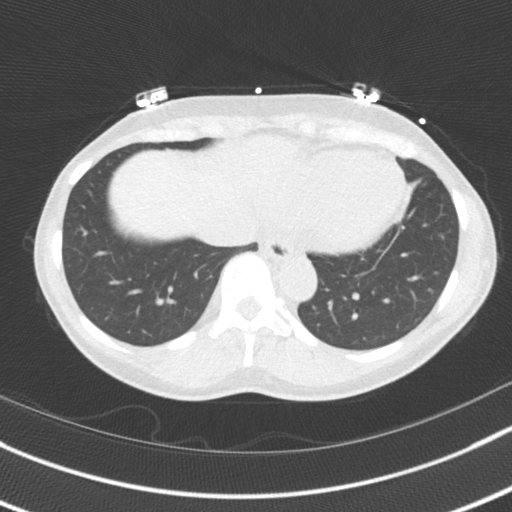
[im 15/45  lung]
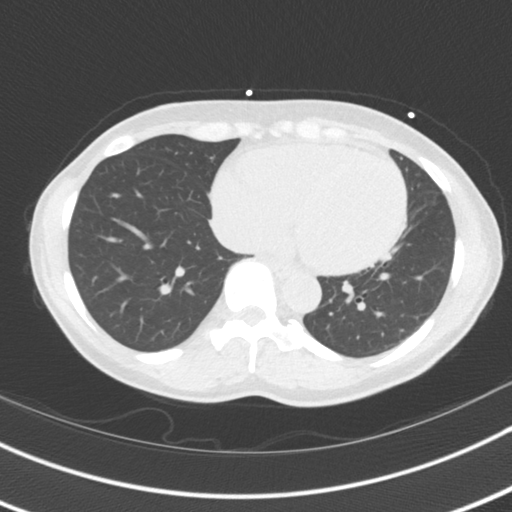
[im 23/45  lung]
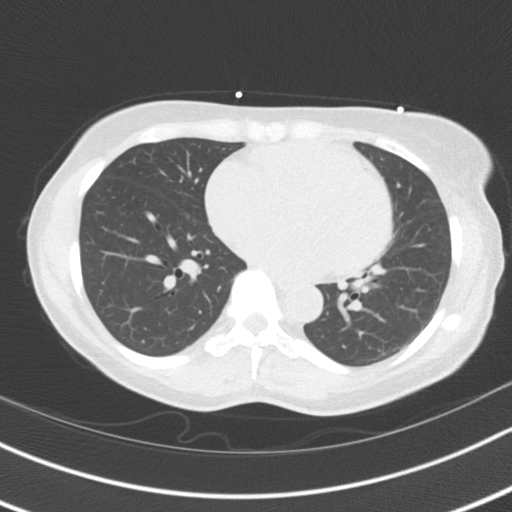
[im 30/45  lung]
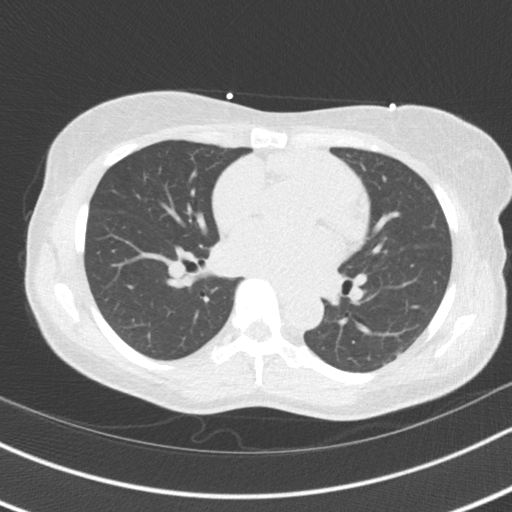
[im 37/45  lung]
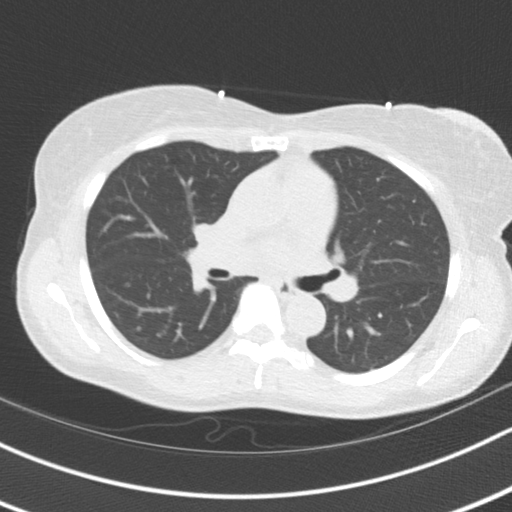

[14 of 20 positions shown; findings below may reference images not displayed]

FINDINGS: Within the visualized portions of the thorax there are no suspicious
appearing pulmonary nodules or masses, there is no acute
consolidative airspace disease, no pleural effusions, no
pneumothorax and no lymphadenopathy. Visualized portions of the
upper abdomen are unremarkable. There are no aggressive appearing
lytic or blastic lesions noted in the visualized portions of the
skeleton.
IMPRESSION: No significant incidental noncardiac findings are noted.
FINDINGS: Non-cardiac: See separate report from [REDACTED].

Ascending aorta: Normal size

Pericardium: Normal

Coronary arteries: Normal origin
IMPRESSION: Coronary calcium score of 0. This was 0 percentile for age and sex
matched control.

Saw Sandhu

*** End of Addendum ***
EXAM:
OVER-READ INTERPRETATION  CT CHEST

The following report is an over-read performed by radiologist Dr.
Oumkeltoum Wassif [REDACTED] on 07/28/2019. This
over-read does not include interpretation of cardiac or coronary
anatomy or pathology. The coronary calcium score/coronary CTA
interpretation by the cardiologist is attached.
FINDINGS: Within the visualized portions of the thorax there are no suspicious
appearing pulmonary nodules or masses, there is no acute
consolidative airspace disease, no pleural effusions, no
pneumothorax and no lymphadenopathy. Visualized portions of the
upper abdomen are unremarkable. There are no aggressive appearing
lytic or blastic lesions noted in the visualized portions of the
skeleton.
IMPRESSION: No significant incidental noncardiac findings are noted.

## 2020-08-25 DIAGNOSIS — M79644 Pain in right finger(s): Secondary | ICD-10-CM | POA: Diagnosis not present

## 2020-08-25 DIAGNOSIS — I998 Other disorder of circulatory system: Secondary | ICD-10-CM | POA: Diagnosis not present

## 2020-08-25 DIAGNOSIS — I7300 Raynaud's syndrome without gangrene: Secondary | ICD-10-CM | POA: Diagnosis not present

## 2020-08-26 ENCOUNTER — Other Ambulatory Visit: Payer: Self-pay

## 2020-08-26 ENCOUNTER — Encounter: Payer: Self-pay | Admitting: Cardiovascular Disease

## 2020-08-26 ENCOUNTER — Ambulatory Visit: Payer: BC Managed Care – PPO | Admitting: Cardiovascular Disease

## 2020-08-26 ENCOUNTER — Telehealth: Payer: Self-pay | Admitting: Cardiovascular Disease

## 2020-08-26 ENCOUNTER — Ambulatory Visit (HOSPITAL_COMMUNITY)
Admission: RE | Admit: 2020-08-26 | Discharge: 2020-08-26 | Disposition: A | Discharge status: Home / Self Care | Payer: BC Managed Care – PPO | Source: Ambulatory Visit | Attending: Cardiovascular Disease | Admitting: Cardiovascular Disease

## 2020-08-26 VITALS — BP 110/72 | HR 74 | Resp 20 | Ht 66.0 in | Wt 152.2 lb

## 2020-08-26 DIAGNOSIS — M79644 Pain in right finger(s): Secondary | ICD-10-CM | POA: Diagnosis not present

## 2020-08-26 DIAGNOSIS — E782 Mixed hyperlipidemia: Secondary | ICD-10-CM | POA: Diagnosis not present

## 2020-08-26 DIAGNOSIS — S4991XA Unspecified injury of right shoulder and upper arm, initial encounter: Secondary | ICD-10-CM | POA: Diagnosis not present

## 2020-08-26 DIAGNOSIS — I742 Embolism and thrombosis of arteries of the upper extremities: Secondary | ICD-10-CM | POA: Insufficient documentation

## 2020-08-26 DIAGNOSIS — M19011 Primary osteoarthritis, right shoulder: Secondary | ICD-10-CM | POA: Diagnosis not present

## 2020-08-26 HISTORY — DX: Pain in right finger(s): M79.644

## 2020-08-26 LAB — POCT I-STAT CREATININE: Creatinine, Ser: 0.9 mg/dL (ref 0.44–1.00)

## 2020-08-26 MED ORDER — IOHEXOL 350 MG/ML SOLN
100.0000 mL | Freq: Once | INTRAVENOUS | Status: AC | PRN
  Administered 2020-08-26: 100 mL via INTRAVENOUS

## 2020-08-26 NOTE — Progress Notes (Signed)
Cardiology Office Note   Date:  08/26/2020   ID:  Sharon Kramer, DOB 06/19/1955, MRN [id number]  PCP:  Ginger Organ., MD  Cardiologist:   Skeet Latch, MD   No chief complaint on file.    History of Present Illness: Sharon Kramer is a 65 y.o. female with mild mitral valve prolapse, hyperlipidemia and SLE  who presents for follow up.  Ms. Sharon Kramer last saw Dr. Ellyn Hack 03/2019.  At that time she reported hand swelling.  Her palpitations were well-controlled at the time.  Echo in 2014 revealed LVEF 60-65% with mild LVH and normal diastolic function.  There was mild bileaflet late MVP and mild MR.  A coronary calcium score was ordered to determine her lipid goal but has not been completed.  On 07/02/19 she exercised and then two hours later she noted tightness in her chest.  She was concerned that she may have COVID and was unable to taste orange juice.  She subsequently tested negative.  She called her PCP who recommended that she take aspirin.  The chest pressure improved throughout the day but didn't go away.  It was consistent for six days and has since subsided.  She did some Apple watch ECG tracings that were either sinus rhythm or inconclusive.  She was concerned that it may be attributable to anxiety.  She has three sons and worries about their safety as Black men in the current climate.  She works out four times per week and has no exertional symptoms.  She notes that her hands swell in the AM.  She has no LE edema, orthopnea or PND. Prior to [place] she was doing crossfit, yoga and dance aerobics.  She does the stairmaster for an hour and has no exertional symptoms.    Ms. Sharon Kramer has a history of mild MVP and has been on atenolol for over 20 years.  She has palpitations that are well-controlled.  At one point it was discontinued due to bradycardia and then she resumed it.  She denies lightheadedness or dizziness.  Ms. Sharon Kramer reported some atypical chest pain.  She had no exertional  symptoms.  She had an echocardiogram 07/2019 that revealed LVEF 60 to 65%, no evidence of mitral valve prolapse, mild mild regurgitation otherwise was unremarkable.  She wore a 14-day event monitor that showed no arrhythmias.  She did have some daytime bradycardia to the 40s, but did not press her button to report symptoms during these times.  She had a coronary calcium score which was 0 and there were no other abnormalities. She saw Dr. Amedeo Plenty on 6/1 due to pain and swelling in her right finger. He was concerned about a vascular event, and started her on steroids and aspirin. She was asked to follow up on cardiology.    A few days ago her right first finger was swollen. Two nights ago she woke up because of throbbing pain and noted her finger was purple. Of note, she states this has not been the same as her episodes of Raynaud's. Dr. Amedeo Plenty prescribed prednisone, and she has not been able to sleep at night, despite exercising and doing laundry. 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 She denies any chest pain, shortness of breath, palpitations, or exertional symptoms. No headaches, lightheadedness, or syncope to report. Also has no lower extremity edema, orthopnea or PND.   Past Medical History:  Diagnosis Date  . Allergy   . Anemia   .  Anxiety disorder   . Atypical chest pain 07/15/2019  . Depression   . Fibroids   . Finger pain, right 08/26/2020  . H/O syncope   . Heart palpitations   . Lupus ([place])    skin   . Mitral valvular prolapse    Mild with mild MR    Past Surgical History:  Procedure Laterality Date  . MYOMECTOMY    . TONSILLECTOMY AND ADENOIDECTOMY    . TOTAL ABDOMINAL HYSTERECTOMY    . TRANSTHORACIC ECHOCARDIOGRAM     Normal LV Function; Mild MV prolapse with mild MR.  . WISDOM TOOTH EXTRACTION       Current Outpatient Medications  Medication Sig Dispense Refill  . atenolol (TENORMIN) 25 MG tablet take 1/2 tablet by mouth twice a day 30  tablet 1  . clonazePAM (KLONOPIN) 1 MG tablet     . EPINEPHrine (EPIPEN JR) 0.15 MG/0.3ML injection Inject 0.15 mg into the muscle as needed.    Marland Kitchen EPINEPHrine 0.3 mg/0.3 mL IJ SOAJ injection SMARTSIG:0.3 Milligram(s) IM Once PRN    . estradiol (CLIMARA - DOSED IN MG/24 HR) 0.025 mg/24hr patch Place 0.025 mg onto the skin every 14 (fourteen) days.    . Multiple Vitamin (MULTIVITAMIN) tablet Take 1 tablet by mouth daily.    Marland Kitchen oxycodone-acetaminophen (PERCOCET) 2.5-325 MG tablet Take 1 tablet by mouth every 4 (four) hours as needed for pain.    . TRINTELLIX 10 MG TABS tablet Take 10 mg by mouth daily.    . predniSONE (DELTASONE) 10 MG tablet 91m 3 pill x 7 days,then 139m2 pills x 7 days, then 10 g 1 pill x 7 days     No current facility-administered medications for this visit.    Allergies:   Peanut-containing drug products, Penicillins, Dust mite extract, Latex, Peanut oil, and Tape    Social History:  The patient  reports that she has never smoked. She has never used smokeless tobacco. She reports that she does not drink alcohol and does not use drugs.   Family History:  The patient's family history includes Cancer in her mother; Colon cancer (age of onset: 6755in her father; Mitral valve prolapse in her mother; Obesity in her sister.    ROS:   Please see the history of present illness. (+) Lesion, Right first finger (+) Edema localized to fingertip, Right first finger (+) Insomnia All other systems are reviewed and negative.    PHYSICAL EXAM: VS:  BP 110/72 (BP Location: Left Arm, Patient Position: Sitting)   Pulse 74   Resp 20   Ht _0  (1.676 m)   Wt 152 lb 3.2 oz (69 kg)   SpO2 99%   BMI 24.57 kg/m  , BMI Body mass index is 24.57 kg/m. GENERAL:  Well appearing HEENT: Pupils equal round and reactive, fundi not visualized, oral mucosa unremarkable NECK:  No jugular venous distention, waveform within normal limits, carotid upstroke brisk and symmetric, no bruits LUNGS:   Clear to auscultation bilaterally HEART:  RRR.  PMI not displaced or sustained,S1 and S2 within normal limits, no S3, no S4, no clicks, no rubs, no murmurs ABD:  Flat, positive bowel sounds normal in frequency in pitch, no bruits, no rebound, no guarding, no midline pulsatile mass, no hepatomegaly, no splenomegaly EXT:  2 plus pulses throughout, no edema, no cyanosis no clubbing SKIN:  No rashes no nodules NEURO:  Cranial nerves II through XII grossly intact, motor grossly intact throughout PSYCH:  Cognitively intact, oriented to person  place and time   EKG:   08/26/2020: Sinus rhythm. Rate 74 bpm. 07/16/19: sinus rhythm.  Rate 61 bpm.  Echo 07/28/19: 1. Left ventricular ejection fraction, by estimation, is 60 to 65%. The  left ventricle has normal function. The left ventricle has no regional  wall motion abnormalities. Left ventricular diastolic parameters were  normal. GLS -24.1%.  2. Right ventricular systolic function is normal. The right ventricular  size is normal. There is mildly elevated pulmonary artery systolic  pressure. The estimated right ventricular systolic pressure is 99.2 mmHg.  3. Atrial septal aneurysm noted.  4. The mitral valve is normal in structure, there does not appear to be  prolapse. Mild mitral valve regurgitation. No evidence of mitral stenosis.  5. Tricuspid valve regurgitation is mild to moderate.  6. The aortic valve is tricuspid. Aortic valve regurgitation is trivial.  No aortic stenosis is present.  7. The inferior vena cava is dilated in size with >50% respiratory  variability, suggesting right atrial pressure of 8 mmHg.   14 Day Event Monitor 07/2019:  Quality: Fair.  Baseline artifact. Predominant rhythm: sinus rhythm Average heart rate: 77 bpm Max heart rate: 179 bpm Min heart rate: 47 bpm Pauses >2.5 seconds: none   No arrhythmias noted. Daytime bradycardia.  No symptoms reported.  Recent Labs: No results found for requested labs  within last 8760 hours.    Lipid Panel No results found for: CHOL, TRIG, HDL, CHOLHDL, VLDL, LDLCALC, LDLDIRECT    Wt Readings from Last 3 Encounters:  08/26/20 152 lb 3.2 oz (69 kg)  09/01/19 143 lb (64.9 kg)  07/15/19 142 lb (64.4 kg)      ASSESSMENT AND PLAN: Finger pain, right Ms. Beazley has pain and swelling in the R finger.  There is also non-blanching violaceous discoloration and associated swelling.  It does not look like classic Osler's nodes or Janeway lesions.  She does not otherwise have any symptoms of endocarditis.  She has no fevers or chills and otherwise feels well.  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  She was already given a dose of prednisone and has been unable to sleep but the swelling does seem to be improving.  We will check an ESR, though granted it may be difficult to interpret in the setting of her already being on steroids.  We will get a CT-A of the right upper extremity with runoff into her right hand.  We will also get an echocardiogram to look for any valvular abnormalities.  If there does seem to be arterial occlusive disease that we will consider getting a TEE.   # Chest pain: She continues to exercise regularly and has no chest pain.  Heart structurally normal on echo.  No plans for an ischemia evaluation at this time.  # CV Disease Prevention: # Hyperlipidemia:  Lipids are elevated.  However she has very healthy diet and lifestyle.  Coronary calcium score was 0.  We discussed the fact that technically she should still be on a statin.  However she would like to avoid taking one.  She is interested in considering fish oil.  She does take this would be 1000 mg daily.  # Mitral valve prolapse:  No evidence of heart failure on exam.  No mitral valve prolapse on echo and mild much regurgitation.  She does not need endocarditis prophylaxis prior to procedures based on valvular guidelines.  #  Palpitations: Continue atenolol.  Current medicines  are reviewed at length with the patient today.  The patient does not have concerns regarding medicines.  The following changes have been made:  no change  Labs/ tests ordered today include:   Orders Placed This Encounter  Procedures  . CT ANGIO UP EXTREM RIGHT W &/OR WO CONTRAST  . Sed Rate (ESR)  . EKG 12-Lead  . ECHOCARDIOGRAM COMPLETE    Disposition:   FU with Tenia Goh C. Oval Linsey, MD, [facility] on scheduled appt. June 22nd.  I,Mathew Stumpf,acting as a Education administrator for Skeet Latch, MD.,have documented all relevant documentation on the behalf of Skeet Latch, MD,as directed by  Skeet Latch, MD while in the presence of Skeet Latch, MD.  I, [place] Oval Linsey, MD have reviewed all documentation for this visit.  The documentation of the exam, diagnosis, procedures, and orders on 08/26/2020 are all accurate and complete.   Signed, Nyima Vanacker C. Oval Linsey, MD, [facility]  08/26/2020 12:41 PM    [facility]

## 2020-08-26 NOTE — Patient Instructions (Addendum)
Medication Instructions:  Your physician recommends that you continue on your current medications as directed. Please refer to the Current Medication list given to you today.  *If you need a refill on your cardiac medications before your next appointment, please call your pharmacy*   Lab Work: Your physician recommends that you have labs drawn today: sed rate   If you have labs (blood work) drawn today and your tests are completely normal, you will receive your results only by: Marland Kitchen MyChart Message (if you have MyChart) OR . A paper copy in the mail If you have any lab test that is abnormal or we need to change your treatment, we will call you to review the results.   Testing/Procedures: Your physician has requested that you have an echocardiogram. Echocardiography is a painless test that uses sound waves to create images of your heart. It provides your doctor with information about the size and shape of your heart and how well your heart's chambers and valves are working. This procedure takes approximately one hour. There are no restrictions for this procedure. This procedure is done at [address]  Non-Cardiac CT Angiography (CTA), is a special type of CT scan that uses a computer to produce multi-dimensional views of major blood vessels throughout the body. In CT angiography, a contrast material is injected through an IV to help visualize the blood vessels   Follow-Up: At [facility], you and your health needs are our priority.  As part of our continuing mission to provide you with exceptional heart care, we have created designated Provider Care Teams.  These Care Teams include your primary Cardiologist (physician) and Advanced Practice Providers (APPs -  Physician Assistants and Nurse Practitioners) who all work together to provide you with the care you need, when you need it.  We recommend signing up for the patient portal called "MyChart".  Sign up information is  provided on this After Visit Summary.  MyChart is used to connect with patients for Virtual Visits (Telemedicine).  Patients are able to view lab/test results, encounter notes, upcoming appointments, etc.  Non-urgent messages can be sent to your provider as well.   To learn more about what you can do with MyChart, go to [email or web address].    Your next appointment:   September 15, 2020 @ 8:20am

## 2020-08-26 NOTE — Telephone Encounter (Signed)
Spoke with patient regarding the 08/26/20 3:30 pm CTA r upper extremity appointment at [facility] time is 3:15 pm 1st floor radiology for check in---patient voiced her understanding.

## 2020-08-26 NOTE — Assessment & Plan Note (Signed)
Sharon Kramer has pain and swelling in the R finger.  There is also non-blanching violaceous discoloration and associated swelling.  It does not look like classic Osler's nodes or Janeway lesions.  She does not otherwise have any symptoms of endocarditis.  She has no fevers or chills and otherwise feels well.  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  She was already given a dose of prednisone and has been unable to sleep but the swelling does seem to be improving.  We will check an ESR, though granted it may be difficult to interpret in the setting of her already being on steroids.  We will get a CT-A of the right upper extremity with runoff into her right hand.  We will also get an echocardiogram to look for any valvular abnormalities.  If there does seem to be arterial occlusive disease that we will consider getting a TEE.

## 2020-08-27 LAB — SEDIMENTATION RATE: Sed Rate: 3 mm/hr (ref 0–40)

## 2020-09-01 DIAGNOSIS — H25013 Cortical age-related cataract, bilateral: Secondary | ICD-10-CM | POA: Diagnosis not present

## 2020-09-01 DIAGNOSIS — H524 Presbyopia: Secondary | ICD-10-CM | POA: Diagnosis not present

## 2020-09-01 DIAGNOSIS — I7300 Raynaud's syndrome without gangrene: Secondary | ICD-10-CM | POA: Diagnosis not present

## 2020-09-01 DIAGNOSIS — H52203 Unspecified astigmatism, bilateral: Secondary | ICD-10-CM | POA: Diagnosis not present

## 2020-09-01 DIAGNOSIS — M79644 Pain in right finger(s): Secondary | ICD-10-CM | POA: Diagnosis not present

## 2020-09-01 DIAGNOSIS — I998 Other disorder of circulatory system: Secondary | ICD-10-CM | POA: Diagnosis not present

## 2020-09-03 ENCOUNTER — Ambulatory Visit (HOSPITAL_COMMUNITY): Payer: BC Managed Care – PPO | Attending: Cardiology

## 2020-09-03 ENCOUNTER — Other Ambulatory Visit: Payer: Self-pay

## 2020-09-03 DIAGNOSIS — I742 Embolism and thrombosis of arteries of the upper extremities: Secondary | ICD-10-CM | POA: Diagnosis not present

## 2020-09-03 LAB — ECHOCARDIOGRAM COMPLETE
Area-P 1/2: 3.99 cm2
S' Lateral: 2.9 cm

## 2020-09-07 ENCOUNTER — Ambulatory Visit: Payer: BC Managed Care – PPO | Admitting: Cardiovascular Disease

## 2020-09-13 NOTE — Progress Notes (Signed)
Cardiology Office Note   Date:  09/15/2020   ID:  Cashae, Weich 1956/01/22, MRN [id number]  PCP:  Cleatis Polka., MD  Cardiologist:   Chilton Si, MD   No chief complaint on file.    History of Present Illness: Sharon Kramer is a 65 y.o. female with mild mitral valve prolapse, hyperlipidemia and SLE  who presents for follow up.  Sharon Kramer last saw Dr. [name] 03/2019.  At that time she reported hand swelling.  Her palpitations were well-controlled at the time.  Echo in 2014 revealed LVEF 60-65% with mild LVH and normal diastolic function.  There was mild bileaflet late MVP and mild MR.  A coronary calcium score was ordered to determine her lipid goal but has not been completed.  On 07/02/19 she exercised and then two hours later she noted tightness in her chest.  She was concerned that she may have COVID and was unable to taste orange juice.  She subsequently tested negative.  She called her PCP who recommended that she take aspirin.  The chest pressure improved throughout the day but didn't go away.  It was consistent for six days and has since subsided.  She did some Apple watch ECG tracings that were either sinus rhythm or inconclusive.  She was concerned that it may be attributable to anxiety.  She has three sons and worries about their safety as Black men in the current climate.  She works out four times per week and has no exertional symptoms.  She notes that her hands swell in the AM.  She has no LE edema, orthopnea or PND. Prior to COVID she was doing crossfit, yoga and dance aerobics.  She does the stairmaster for an hour and has no exertional symptoms.    Sharon Kramer has a history of mild MVP and has been on atenolol for over 20 years.  She has palpitations that are well-controlled.  At one point it was discontinued due to bradycardia and then she resumed it.  She denies lightheadedness or dizziness.  Sharon Kramer reported some atypical chest pain.  She had no exertional  symptoms.  She had an echocardiogram 07/2019 that revealed LVEF 60 to 65%, no evidence of mitral valve prolapse, mild mild regurgitation otherwise was unremarkable.  She wore a 14-day event monitor that showed no arrhythmias.  She did have some daytime bradycardia to the 40s, but did not press her button to report symptoms during these times.  She had a coronary calcium score which was 0 and there were no other abnormalities. She saw Dr. Amanda Pea on 6/1 due to pain and swelling in her right finger. He was concerned about a vascular event, and started her on steroids and aspirin. She was asked to follow up on cardiology.    On exam it did not seem consistent with an embolic event. She had an echo that was 08/2020 with LVEF 60-65% and grade 1 diastolic dysfunction but was otherwise unremarkable. She had a CT-A of the right UE that showed a pulmonary nodule but otherwise was unremarkable. ESR was within normal limits.  Today, she presents for follow-up s/p Echo (09/03/2020). We discussed her results. She reports she continues to have some finger pain and numbness due to Raynaud's but the skin discoloration has resolved. She has discovered that when she is off atenolol, her palpitations return and are bothersome. However, the atenolol does help her and is currently on her regimen. Although her blood pressure is  low at times she denies feeling lightheaded or otherwise ill. She denies any chest pain, shortness of breath, or exertional symptoms. No headaches, or syncope to report. Also has no lower extremity edema, orthopnea or PND.   Past Medical History:  Diagnosis Date   Allergy    Anemia    Anxiety disorder    Atypical chest pain 07/15/2019   Depression    Fibroids    Finger pain, right 08/26/2020   H/O syncope    Heart palpitations    Lupus (HCC)    skin    Mitral valvular prolapse    Mild with mild MR   Pulmonary nodule 09/15/2020    Past Surgical History:  Procedure Laterality Date   MYOMECTOMY      TONSILLECTOMY AND ADENOIDECTOMY     TOTAL ABDOMINAL HYSTERECTOMY     TRANSTHORACIC ECHOCARDIOGRAM     Normal LV Function; Mild MV prolapse with mild MR.   WISDOM TOOTH EXTRACTION       Current Outpatient Medications  Medication Sig Dispense Refill   atenolol (TENORMIN) 25 MG tablet take 1/2 tablet by mouth twice a day 30 tablet 1   clonazePAM (KLONOPIN) 1 MG tablet      EPINEPHrine 0.3 mg/0.3 mL IJ SOAJ injection SMARTSIG:0.3 Milligram(s) IM Once PRN     estradiol (CLIMARA - DOSED IN MG/24 HR) 0.025 mg/24hr patch Place 0.025 mg onto the skin every 14 (fourteen) days.     Multiple Vitamin (MULTIVITAMIN) tablet Take 1 tablet by mouth daily.     TRINTELLIX 10 MG TABS tablet Take 10 mg by mouth daily.     No current facility-administered medications for this visit.    Allergies:   Peanut-containing drug products, Penicillins, Dust mite extract, Latex, Peanut oil, and Tape    Social History:  The patient  reports that she has never smoked. She has never used smokeless tobacco. She reports that she does not drink alcohol and does not use drugs.   Family History:  The patient's family history includes Cancer in her mother; Colon cancer (age of onset: 29) in her father; Mitral valve prolapse in her mother; Obesity in her sister.    ROS:   Please see the history of present illness. All other systems are reviewed and negative.    PHYSICAL EXAM: VS:  BP 100/72   Pulse 66   Ht $R'5\' 6"'ml$  (1.676 m)   Wt 154 lb 6.4 oz (70 kg)   SpO2 99%   BMI 24.92 kg/m  , BMI Body mass index is 24.92 kg/m. GENERAL:  Well appearing HEENT: Pupils equal round and reactive, fundi not visualized, oral mucosa unremarkable NECK:  No jugular venous distention, waveform within normal limits, carotid upstroke brisk and symmetric, no bruits LUNGS:  Clear to auscultation bilaterally HEART:  RRR.  PMI not displaced or sustained,S1 and S2 within normal limits, no S3, no S4, no clicks, no rubs, no murmurs ABD:  Flat,  positive bowel sounds normal in frequency in pitch, no bruits, no rebound, no guarding, no midline pulsatile mass, no hepatomegaly, no splenomegaly EXT:  2 plus pulses throughout, no edema, no cyanosis no clubbing SKIN:  No rashes no nodules NEURO:  Cranial nerves II through XII grossly intact, motor grossly intact throughout PSYCH:  Cognitively intact, oriented to person place and time   EKG:   09/15/2020: EKG is not ordered today. 08/26/2020: Sinus rhythm. Rate 74 bpm. 07/16/19: sinus rhythm.  Rate 61 bpm.  Echo 09/03/2020: 1. Left ventricular ejection fraction, by  estimation, is 60 to 65%. The  left ventricle has normal function. The left ventricle has no regional  wall motion abnormalities. Left ventricular diastolic parameters are  consistent with Grade I diastolic  dysfunction (impaired relaxation).   2. Right ventricular systolic function is normal. The right ventricular  size is normal. There is normal pulmonary artery systolic pressure. The  estimated right ventricular systolic pressure is 61.2 mmHg.   3. The mitral valve is normal in structure. Trivial mitral valve  regurgitation. No evidence of mitral stenosis.   4. Tricuspid valve regurgitation is mild to moderate.   5. The aortic valve is tricuspid. Aortic valve regurgitation is trivial.  No aortic stenosis is present.   6. The inferior vena cava is normal in size with greater than 50%  respiratory variability, suggesting right atrial pressure of 3 mmHg.   Comparison(s): No significant change from prior study. No evidence of  vavular vegetations or thrombi. Degree of TR is consistent with prior with  no new valvular lesions noted.   Echo 07/28/19: 1. Left ventricular ejection fraction, by estimation, is 60 to 65%. The  left ventricle has normal function. The left ventricle has no regional  wall motion abnormalities. Left ventricular diastolic parameters were  normal. GLS -24.1%.   2. Right ventricular systolic function is  normal. The right ventricular  size is normal. There is mildly elevated pulmonary artery systolic  pressure. The estimated right ventricular systolic pressure is 24.4 mmHg.   3. Atrial septal aneurysm noted.   4. The mitral valve is normal in structure, there does not appear to be  prolapse. Mild mitral valve regurgitation. No evidence of mitral stenosis.   5. Tricuspid valve regurgitation is mild to moderate.   6. The aortic valve is tricuspid. Aortic valve regurgitation is trivial.  No aortic stenosis is present.   7. The inferior vena cava is dilated in size with >50% respiratory  variability, suggesting right atrial pressure of 8 mmHg.   14 Day Event Monitor 07/2019:   Quality: Fair.  Baseline artifact. Predominant rhythm: sinus rhythm Average heart rate: 77 bpm Max heart rate: 179 bpm Min heart rate: 47 bpm Pauses >2.5 seconds: none    No arrhythmias noted. Daytime bradycardia.  No symptoms reported.  Recent Labs: 08/26/2020: Creatinine, Ser 0.90    Lipid Panel No results found for: CHOL, TRIG, HDL, CHOLHDL, VLDL, LDLCALC, LDLDIRECT    Wt Readings from Last 3 Encounters:  09/15/20 154 lb 6.4 oz (70 kg)  08/26/20 152 lb 3.2 oz (69 kg)  09/01/19 143 lb (64.9 kg)      ASSESSMENT AND PLAN: Heart palpitations Stable on atenolol.  She had been taking 25 mg of atenolol, not knowing that it was written for 12.5.  Given that her blood pressures are low, she will reduce it to the 12.5 mg that is ordered.  Finger pain, right Was to have resolved.  ESR was negative and there was no evidence of occlusive disease on her CT.  No embolic source noted on transthoracic echo and she has no other lesions.  I suspect this may have been related to her Raynaud's disease.  If she has a recurrent episode we will try some topical sildenafil.  Hyperlipidemia, mixed Continue with diet and exercise.  Coronary calcium score is 0.  She prefers to avoid statins.  Pulmonary nodule 6 cm nodule  incidentally noted on CT 08/2020.  Repeat noncontrast chest CT in 6 months.   Current medicines are reviewed at length with  the patient today.  The patient does not have concerns regarding medicines.  The following changes have been made:  no change  Labs/ tests ordered today include:   Orders Placed This Encounter  Procedures   CT Chest Wo Contrast    Disposition:   FU with Zira Helinski C. Oval Linsey, MD, [facility] in 1 year.  I,Mathew Stumpf,acting as a Education administrator for Skeet Latch, MD.,have documented all relevant documentation on the behalf of Skeet Latch, MD,as directed by  Skeet Latch, MD while in the presence of Skeet Latch, MD.  I, Lea Oval Linsey, MD have reviewed all documentation for this visit.  The documentation of the exam, diagnosis, procedures, and orders on 09/15/2020 are all accurate and complete.   Signed, Deirdra Heumann C. Oval Linsey, MD, [facility]  09/15/2020 8:40 AM    [place]

## 2020-09-15 ENCOUNTER — Ambulatory Visit: Payer: BC Managed Care – PPO | Admitting: Cardiovascular Disease

## 2020-09-15 ENCOUNTER — Encounter: Payer: Self-pay | Admitting: Cardiovascular Disease

## 2020-09-15 ENCOUNTER — Other Ambulatory Visit: Payer: Self-pay

## 2020-09-15 VITALS — BP 100/72 | HR 66 | Ht 66.0 in | Wt 154.4 lb

## 2020-09-15 DIAGNOSIS — E782 Mixed hyperlipidemia: Secondary | ICD-10-CM

## 2020-09-15 DIAGNOSIS — R002 Palpitations: Secondary | ICD-10-CM | POA: Diagnosis not present

## 2020-09-15 DIAGNOSIS — R911 Solitary pulmonary nodule: Secondary | ICD-10-CM

## 2020-09-15 HISTORY — DX: Solitary pulmonary nodule: R91.1

## 2020-09-15 NOTE — Assessment & Plan Note (Signed)
Was to have resolved.  ESR was negative and there was no evidence of occlusive disease on her CT.  No embolic source noted on transthoracic echo and she has no other lesions.  I suspect this may have been related to her Raynaud's disease.  If she has a recurrent episode we will try some topical sildenafil.

## 2020-09-15 NOTE — Patient Instructions (Signed)
Medication Instructions:  Your physician recommends that you continue on your current medications as directed. Please refer to the Current Medication list given to you today.   *If you need a refill on your cardiac medications before your next appointment, please call your pharmacy*  Lab Work: NONE   Testing/Procedures: Non-Cardiac CT scanning, (CAT scanning), is a noninvasive, special x-ray that produces cross-sectional images of the body using x-rays and a computer. CT scans help physicians diagnose and treat medical conditions. For some CT exams, a contrast material is used to enhance visibility in the area of the body being studied. CT scans provide greater clarity and reveal more details than regular x-ray exams. OF CHEST IN 6 MONTHS   Follow-Up: At [facility], you and your health needs are our priority.  As part of our continuing mission to provide you with exceptional heart care, we have created designated Provider Care Teams.  These Care Teams include your primary Cardiologist (physician) and Advanced Practice Providers (APPs -  Physician Assistants and Nurse Practitioners) who all work together to provide you with the care you need, when you need it.  We recommend signing up for the patient portal called "MyChart".  Sign up information is provided on this After Visit Summary.  MyChart is used to connect with patients for Virtual Visits (Telemedicine).  Patients are able to view lab/test results, encounter notes, upcoming appointments, etc.  Non-urgent messages can be sent to your provider as well.   To learn more about what you can do with MyChart, go to [email or web address].    Your next appointment:   12 month(s)  The format for your next appointment:   In Person  Provider:   DR [place]

## 2020-09-15 NOTE — Assessment & Plan Note (Signed)
6 cm nodule incidentally noted on CT 08/2020.  Repeat noncontrast chest CT in 6 months.

## 2020-09-15 NOTE — Assessment & Plan Note (Signed)
Stable on atenolol.  She had been taking 25 mg of atenolol, not knowing that it was written for 12.5.  Given that her blood pressures are low, she will reduce it to the 12.5 mg that is ordered.

## 2020-09-15 NOTE — Assessment & Plan Note (Signed)
Continue with diet and exercise.  Coronary calcium score is 0.  She prefers to avoid statins.

## 2020-09-17 ENCOUNTER — Telehealth: Payer: Self-pay | Admitting: Cardiovascular Disease

## 2020-09-17 MED ORDER — NITROGLYCERIN 2 % TD OINT: 0.5000 [in_us] | TOPICAL_OINTMENT | Freq: Three times a day (TID) | TRANSDERMAL | 1 refills | Status: DC | PRN

## 2020-09-17 NOTE — Telephone Encounter (Signed)
New Message:      Pt called and said her ring finger on her left hand have turned purple. She said she was instructed by Dr Sharon Kramer to call if this happen the patient said Dr Sharon Kramer said she would call something in for it.

## 2020-09-17 NOTE — Telephone Encounter (Signed)
Spoke with patient and her ring finger on other hand is now purple  Per patient she was told by Dr Oval Linsey to call in if this happened again for topical cream  Finger pain, right Was to have resolved.  ESR was negative and there was no evidence of occlusive disease on her CT.  No embolic source noted on transthoracic echo and she has no other lesions.  I suspect this may have been related to her Raynaud's disease.  If she has a recurrent episode we will try some topical sildenafil.  Above taken from visit with Dr Oval Linsey 6/22  Discussed with Sharon Kramer Pharm D and she is unaware of how this is dosed, nothing in Epic Did call [facility] and they can do compound  Will forward to Dr Oval Linsey for review

## 2020-09-17 NOTE — Telephone Encounter (Signed)
Discussed with Dr Oval Linsey and she recommended NTG 2% ointment apply 0.5 inch as needed to affected finger Advised patient, verbalized understanding

## 2020-10-11 DIAGNOSIS — R051 Acute cough: Secondary | ICD-10-CM | POA: Diagnosis not present

## 2020-10-11 DIAGNOSIS — Z20828 Contact with and (suspected) exposure to other viral communicable diseases: Secondary | ICD-10-CM | POA: Diagnosis not present

## 2020-10-11 DIAGNOSIS — B349 Viral infection, unspecified: Secondary | ICD-10-CM | POA: Diagnosis not present

## 2020-10-21 DIAGNOSIS — E785 Hyperlipidemia, unspecified: Secondary | ICD-10-CM | POA: Diagnosis not present

## 2020-10-28 DIAGNOSIS — E785 Hyperlipidemia, unspecified: Secondary | ICD-10-CM | POA: Diagnosis not present

## 2020-10-28 DIAGNOSIS — Z1331 Encounter for screening for depression: Secondary | ICD-10-CM | POA: Diagnosis not present

## 2020-10-28 DIAGNOSIS — Z0000 Encounter for general adult medical examination without abnormal findings: Secondary | ICD-10-CM | POA: Diagnosis not present

## 2020-10-28 DIAGNOSIS — R82998 Other abnormal findings in urine: Secondary | ICD-10-CM | POA: Diagnosis not present

## 2020-11-03 DIAGNOSIS — M25561 Pain in right knee: Secondary | ICD-10-CM | POA: Diagnosis not present

## 2020-11-03 DIAGNOSIS — M170 Bilateral primary osteoarthritis of knee: Secondary | ICD-10-CM | POA: Diagnosis not present

## 2020-12-01 ENCOUNTER — Ambulatory Visit (HOSPITAL_BASED_OUTPATIENT_CLINIC_OR_DEPARTMENT_OTHER): Payer: BC Managed Care – PPO | Admitting: Cardiovascular Disease

## 2021-02-25 ENCOUNTER — Ambulatory Visit (INDEPENDENT_AMBULATORY_CARE_PROVIDER_SITE_OTHER)
Admission: RE | Admit: 2021-02-25 | Discharge: 2021-02-25 | Disposition: A | Discharge status: Home / Self Care | Payer: BC Managed Care – PPO | Source: Ambulatory Visit | Attending: Cardiovascular Disease | Admitting: Cardiovascular Disease

## 2021-02-25 ENCOUNTER — Other Ambulatory Visit: Payer: Self-pay

## 2021-02-25 DIAGNOSIS — R911 Solitary pulmonary nodule: Secondary | ICD-10-CM | POA: Diagnosis not present

## 2021-02-25 DIAGNOSIS — R918 Other nonspecific abnormal finding of lung field: Secondary | ICD-10-CM | POA: Diagnosis not present

## 2021-03-02 DIAGNOSIS — M1711 Unilateral primary osteoarthritis, right knee: Secondary | ICD-10-CM | POA: Diagnosis not present

## 2021-03-11 ENCOUNTER — Encounter (HOSPITAL_BASED_OUTPATIENT_CLINIC_OR_DEPARTMENT_OTHER): Payer: Self-pay | Admitting: Cardiovascular Disease

## 2021-03-11 ENCOUNTER — Telehealth: Payer: Self-pay | Admitting: Cardiovascular Disease

## 2021-03-11 DIAGNOSIS — E041 Nontoxic single thyroid nodule: Secondary | ICD-10-CM

## 2021-03-11 NOTE — Telephone Encounter (Signed)
Spoke with patient and order thyroid US

## 2021-03-11 NOTE — Telephone Encounter (Signed)
° °  Pt is calling back, she said she missed a cb from [employer] and making sure she doesn't need any further info from her

## 2021-03-11 NOTE — Telephone Encounter (Signed)
Please advise 

## 2021-03-11 NOTE — Telephone Encounter (Signed)
Advised patient called by mistake

## 2021-03-14 ENCOUNTER — Telehealth (HOSPITAL_BASED_OUTPATIENT_CLINIC_OR_DEPARTMENT_OTHER): Payer: Self-pay | Admitting: Cardiovascular Disease

## 2021-03-14 NOTE — Telephone Encounter (Signed)
Spoke with patient regarding the Monday 03/21/21 8:00 am thyroid ultrasound appt at [facility]---arrival time is 8:00 am for check in ---patient voiced her understanding.

## 2021-03-15 ENCOUNTER — Telehealth: Payer: Self-pay | Admitting: Cardiovascular Disease

## 2021-03-15 ENCOUNTER — Other Ambulatory Visit (HOSPITAL_BASED_OUTPATIENT_CLINIC_OR_DEPARTMENT_OTHER): Payer: Self-pay

## 2021-03-15 DIAGNOSIS — E041 Nontoxic single thyroid nodule: Secondary | ICD-10-CM

## 2021-03-15 NOTE — Telephone Encounter (Signed)
Sharon Kramer from Radiology calling to request a signed order for the patient's thyroid ultrasound scheduled 12/22. Fax:[phone]

## 2021-03-16 ENCOUNTER — Telehealth (HOSPITAL_BASED_OUTPATIENT_CLINIC_OR_DEPARTMENT_OTHER): Payer: Self-pay

## 2021-03-16 NOTE — Telephone Encounter (Signed)
Pt. Called into the office saying she was being told that without a signed fax order within 15 minutes Elberta Fortis from radiology would be cancelling her scan.   RN spoke with Elberta Fortis to attempt to fix the problem. Able to get Provider in the office to get the order printed and faxed to the requested number.   RN called radiology department to inform him that was sent.   RN called pt. Back to inform her that signed and fax order has been sent.

## 2021-03-17 ENCOUNTER — Other Ambulatory Visit: Payer: Self-pay

## 2021-03-17 ENCOUNTER — Ambulatory Visit (HOSPITAL_BASED_OUTPATIENT_CLINIC_OR_DEPARTMENT_OTHER)
Admission: RE | Admit: 2021-03-17 | Discharge: 2021-03-17 | Disposition: A | Discharge status: Home / Self Care | Payer: BC Managed Care – PPO | Source: Ambulatory Visit | Attending: Cardiovascular Disease | Admitting: Cardiovascular Disease

## 2021-03-17 DIAGNOSIS — E041 Nontoxic single thyroid nodule: Secondary | ICD-10-CM | POA: Insufficient documentation

## 2021-03-21 ENCOUNTER — Ambulatory Visit (HOSPITAL_BASED_OUTPATIENT_CLINIC_OR_DEPARTMENT_OTHER): Payer: BC Managed Care – PPO

## 2021-03-22 ENCOUNTER — Other Ambulatory Visit: Payer: Self-pay | Admitting: Obstetrics and Gynecology

## 2021-03-22 DIAGNOSIS — Z13820 Encounter for screening for osteoporosis: Secondary | ICD-10-CM

## 2021-03-23 NOTE — Telephone Encounter (Signed)
Exam completed

## 2021-03-25 ENCOUNTER — Ambulatory Visit
Admission: RE | Admit: 2021-03-25 | Discharge: 2021-03-25 | Disposition: A | Discharge status: Home / Self Care | Payer: BC Managed Care – PPO | Source: Ambulatory Visit | Attending: Obstetrics and Gynecology | Admitting: Obstetrics and Gynecology

## 2021-03-25 DIAGNOSIS — Z780 Asymptomatic menopausal state: Secondary | ICD-10-CM | POA: Diagnosis not present

## 2021-03-25 DIAGNOSIS — Z13820 Encounter for screening for osteoporosis: Secondary | ICD-10-CM

## 2021-04-07 ENCOUNTER — Other Ambulatory Visit: Payer: Self-pay | Admitting: Obstetrics and Gynecology

## 2021-04-07 DIAGNOSIS — Z13820 Encounter for screening for osteoporosis: Secondary | ICD-10-CM

## 2021-04-12 ENCOUNTER — Other Ambulatory Visit: Payer: Self-pay | Admitting: Obstetrics and Gynecology

## 2021-04-12 DIAGNOSIS — Z13820 Encounter for screening for osteoporosis: Secondary | ICD-10-CM

## 2021-04-19 ENCOUNTER — Encounter (HOSPITAL_BASED_OUTPATIENT_CLINIC_OR_DEPARTMENT_OTHER): Payer: Self-pay | Admitting: Cardiovascular Disease

## 2021-04-19 NOTE — Telephone Encounter (Signed)
Please advise 

## 2021-04-29 NOTE — Telephone Encounter (Signed)
Spoke with patient  Results have been forwarded to Dr Brigitte Pulse

## 2021-05-02 DIAGNOSIS — E785 Hyperlipidemia, unspecified: Secondary | ICD-10-CM | POA: Diagnosis not present

## 2021-05-02 DIAGNOSIS — Z1211 Encounter for screening for malignant neoplasm of colon: Secondary | ICD-10-CM | POA: Diagnosis not present

## 2021-05-02 DIAGNOSIS — Z79890 Hormone replacement therapy (postmenopausal): Secondary | ICD-10-CM | POA: Diagnosis not present

## 2021-05-02 DIAGNOSIS — E041 Nontoxic single thyroid nodule: Secondary | ICD-10-CM | POA: Diagnosis not present

## 2021-05-02 DIAGNOSIS — Z1231 Encounter for screening mammogram for malignant neoplasm of breast: Secondary | ICD-10-CM | POA: Diagnosis not present

## 2021-05-04 DIAGNOSIS — E041 Nontoxic single thyroid nodule: Secondary | ICD-10-CM | POA: Diagnosis not present

## 2021-05-05 ENCOUNTER — Other Ambulatory Visit: Payer: Self-pay | Admitting: Internal Medicine

## 2021-05-05 DIAGNOSIS — E215 Disorder of parathyroid gland, unspecified: Secondary | ICD-10-CM

## 2021-06-02 ENCOUNTER — Other Ambulatory Visit: Payer: BC Managed Care – PPO

## 2021-06-16 ENCOUNTER — Ambulatory Visit
Admission: RE | Admit: 2021-06-16 | Discharge: 2021-06-16 | Disposition: A | Discharge status: Home / Self Care | Payer: BC Managed Care – PPO | Source: Ambulatory Visit | Attending: Internal Medicine | Admitting: Internal Medicine

## 2021-06-16 DIAGNOSIS — E041 Nontoxic single thyroid nodule: Secondary | ICD-10-CM | POA: Diagnosis not present

## 2021-06-16 DIAGNOSIS — E215 Disorder of parathyroid gland, unspecified: Secondary | ICD-10-CM

## 2021-07-13 DIAGNOSIS — L920 Granuloma annulare: Secondary | ICD-10-CM | POA: Diagnosis not present

## 2021-09-13 DIAGNOSIS — H25813 Combined forms of age-related cataract, bilateral: Secondary | ICD-10-CM | POA: Diagnosis not present

## 2021-09-13 DIAGNOSIS — H52203 Unspecified astigmatism, bilateral: Secondary | ICD-10-CM | POA: Diagnosis not present

## 2021-09-13 DIAGNOSIS — D23122 Other benign neoplasm of skin of left lower eyelid, including canthus: Secondary | ICD-10-CM | POA: Diagnosis not present

## 2021-09-13 DIAGNOSIS — H40013 Open angle with borderline findings, low risk, bilateral: Secondary | ICD-10-CM | POA: Diagnosis not present

## 2021-09-22 DIAGNOSIS — L858 Other specified epidermal thickening: Secondary | ICD-10-CM | POA: Diagnosis not present

## 2021-09-22 DIAGNOSIS — L920 Granuloma annulare: Secondary | ICD-10-CM | POA: Diagnosis not present

## 2021-10-04 DIAGNOSIS — L920 Granuloma annulare: Secondary | ICD-10-CM | POA: Diagnosis not present

## 2021-11-22 DIAGNOSIS — E041 Nontoxic single thyroid nodule: Secondary | ICD-10-CM | POA: Diagnosis not present

## 2021-11-22 DIAGNOSIS — E785 Hyperlipidemia, unspecified: Secondary | ICD-10-CM | POA: Diagnosis not present

## 2021-11-22 DIAGNOSIS — E215 Disorder of parathyroid gland, unspecified: Secondary | ICD-10-CM | POA: Diagnosis not present

## 2021-11-29 DIAGNOSIS — D649 Anemia, unspecified: Secondary | ICD-10-CM | POA: Diagnosis not present

## 2021-11-29 DIAGNOSIS — R82998 Other abnormal findings in urine: Secondary | ICD-10-CM | POA: Diagnosis not present

## 2021-11-29 DIAGNOSIS — Z1331 Encounter for screening for depression: Secondary | ICD-10-CM | POA: Diagnosis not present

## 2021-11-29 DIAGNOSIS — Z0000 Encounter for general adult medical examination without abnormal findings: Secondary | ICD-10-CM | POA: Diagnosis not present

## 2021-11-29 DIAGNOSIS — Z1339 Encounter for screening examination for other mental health and behavioral disorders: Secondary | ICD-10-CM | POA: Diagnosis not present

## 2021-11-29 DIAGNOSIS — I341 Nonrheumatic mitral (valve) prolapse: Secondary | ICD-10-CM | POA: Diagnosis not present

## 2021-12-07 DIAGNOSIS — L920 Granuloma annulare: Secondary | ICD-10-CM | POA: Diagnosis not present

## 2021-12-07 DIAGNOSIS — L245 Irritant contact dermatitis due to other chemical products: Secondary | ICD-10-CM | POA: Diagnosis not present

## 2022-01-11 DIAGNOSIS — H40013 Open angle with borderline findings, low risk, bilateral: Secondary | ICD-10-CM | POA: Diagnosis not present

## 2022-02-21 DIAGNOSIS — M25562 Pain in left knee: Secondary | ICD-10-CM | POA: Diagnosis not present

## 2022-03-03 DIAGNOSIS — D485 Neoplasm of uncertain behavior of skin: Secondary | ICD-10-CM | POA: Diagnosis not present

## 2022-03-16 NOTE — Progress Notes (Incomplete)
Cardiology Office Note   Date:  03/16/2022   ID:  Sharon Kramer, Sharon Kramer [birth year], MRN [id number]  PCP:  Ginger Organ., MD  Cardiologist:   Milon Score   No chief complaint on file.     History of Present Illness: Sharon Kramer is a 66 y.o. female with mild mitral valve prolapse, hyperlipidemia and SLE  who presents for follow up.  Ms. Sharon Kramer last saw Dr. Ellyn Hack 03/2019.  At that time she reported hand swelling.  Her palpitations were well-controlled at the time.  Echo in 2014 revealed LVEF 60-65% with mild LVH and normal diastolic function.  There was mild bileaflet late MVP and mild MR.  A coronary calcium score was ordered to determine her lipid goal but has not been completed.  On 07/02/19 she exercised and then two hours later she noted tightness in her chest.  She was concerned that she may have COVID and was unable to taste orange juice.  She subsequently tested negative.  She called her PCP who recommended that she take aspirin.  The chest pressure improved throughout the day but didn't go away.  It was consistent for six days and has since subsided.  She did some Apple watch ECG tracings that were either sinus rhythm or inconclusive.  She was concerned that it may be attributable to anxiety.  She has three sons and worries about their safety as Black men in the current climate.  She works out four times per week and has no exertional symptoms.  She notes that her hands swell in the AM.  She has no LE edema, orthopnea or PND. Prior to [place] she was doing crossfit, yoga and dance aerobics.  She does the stairmaster for an hour and has no exertional symptoms.    Ms. Sharon Kramer has a history of mild MVP and has been on atenolol for over 20 years.  She has palpitations that are well-controlled.  At one point it was discontinued due to bradycardia and then she resumed it.  She denies lightheadedness or dizziness.  Ms. Sharon Kramer reported some atypical chest pain.  She had no exertional  symptoms.  She had an echocardiogram 07/2019 that revealed LVEF 60 to 65%, no evidence of mitral valve prolapse, mild mild regurgitation otherwise was unremarkable.  She wore a 14-day event monitor that showed no arrhythmias.  She did have some daytime bradycardia to the 40s, but did not press her button to report symptoms during these times.  She had a coronary calcium score which was 0 and there were no other abnormalities. She saw Dr. Amedeo Plenty on 6/1 due to pain and swelling in her right finger. He was concerned about a vascular event, and started her on steroids and aspirin. She was asked to follow up on cardiology.    On exam it did not seem consistent with an embolic event. She had an echo that was 08/2020 with LVEF 60-65% and grade 1 diastolic dysfunction but was otherwise unremarkable. She had a CT-A of the right UE that showed a pulmonary nodule but otherwise was unremarkable. ESR was within normal limits.   At her last visit, she reported that she continued to have some finger pain and numbness due to Raynaud's but the skin discoloration had resolved. She had discovered that when she is off atenolol, her palpitations return and are bothersome. She had been taking 25 mg of atenolol, not knowing that it was written for 12.5. Given that her blood pressures were  low, we reduced atenolol to the 12.5 mg.  Although her blood pressure was low at times she denied feeling lightheaded or otherwise ill.  Today, she presents for follow-up s/p Echo (09/03/2020). We discussed her results. She reports she continues to have some finger pain and numbness due to Raynaud's but the skin discoloration has resolved. She has discovered that when she is off atenolol, her palpitations return and are bothersome. However, the atenolol does help her and is currently on her regimen. Although her blood pressure is low at times she denies feeling lightheaded or otherwise ill. She denies any chest pain, shortness of breath, or exertional  symptoms. No headaches, or syncope to report. Also has no lower extremity edema, orthopnea or PND.   Today, the patient states that   She denies any palpitations, chest pain, shortness of breath, or peripheral edema. No lightheadedness, headaches, syncope, orthopnea, or PND.   Past Medical History:  Diagnosis Date   Allergy    Anemia    Anxiety disorder    Atypical chest pain 07/15/2019   Depression    Fibroids    Finger pain, right 08/26/2020   H/O syncope    Heart palpitations    Lupus (HCC)    skin    Mitral valvular prolapse    Mild with mild MR   Pulmonary nodule 09/15/2020    Past Surgical History:  Procedure Laterality Date   MYOMECTOMY     TONSILLECTOMY AND ADENOIDECTOMY     TOTAL ABDOMINAL HYSTERECTOMY     TRANSTHORACIC ECHOCARDIOGRAM     Normal LV Function; Mild MV prolapse with mild MR.   WISDOM TOOTH EXTRACTION       Current Outpatient Medications  Medication Sig Dispense Refill   atenolol (TENORMIN) 25 MG tablet take 1/2 tablet by mouth twice a day 30 tablet 1   clonazePAM (KLONOPIN) 1 MG tablet      EPINEPHrine 0.3 mg/0.3 mL IJ SOAJ injection SMARTSIG:0.3 Milligram(s) IM Once PRN     estradiol (CLIMARA - DOSED IN MG/24 HR) 0.025 mg/24hr patch Place 0.025 mg onto the skin every 14 (fourteen) days.     Multiple Vitamin (MULTIVITAMIN) tablet Take 1 tablet by mouth daily.     nitroGLYCERIN (NITROGLYN) 2 % ointment Apply 0.5 inches topically 3 (three) times daily as needed (to affected finger for Raynaud's). 30 g 1   TRINTELLIX 10 MG TABS tablet Take 10 mg by mouth daily.     No current facility-administered medications for this visit.    Allergies:   Peanut-containing drug products, Penicillins, Dust mite extract, Latex, Peanut oil, and Tape    Social History:  The patient  reports that she has never smoked. She has never used smokeless tobacco. She reports that she does not drink alcohol and does not use drugs.   Family History:  The patient's family  history includes Cancer in her mother; Colon cancer (age of onset: 64) in her father; Mitral valve prolapse in her mother; Obesity in her sister.    ROS:   Please see the history of present illness.  All other systems are reviewed and negative.    PHYSICAL EXAM: VS:  There were no vitals taken for this visit. , BMI There is no height or weight on file to calculate BMI. GENERAL:  Well appearing HEENT: Pupils equal round and reactive, fundi not visualized, oral mucosa unremarkable NECK:  No jugular venous distention, waveform within normal limits, carotid upstroke brisk and symmetric, no bruits LUNGS:  Clear to auscultation  bilaterally HEART:  RRR.  PMI not displaced or sustained,S1 and S2 within normal limits, no S3, no S4, no clicks, no rubs, no murmurs ABD:  Flat, positive bowel sounds normal in frequency in pitch, no bruits, no rebound, no guarding, no midline pulsatile mass, no hepatomegaly, no splenomegaly EXT:  2 plus pulses throughout, no edema, no cyanosis no clubbing SKIN:  No rashes no nodules NEURO:  Cranial nerves II through XII grossly intact, motor grossly intact throughout PSYCH:  Cognitively intact, oriented to person place and time   EKG:  EKG is personally reviewed.  03/23/2022: Sinus ***. Rate *** bpm.  09/15/2020: EKG is not ordered today. 08/26/2020: Sinus rhythm. Rate 74 bpm. 07/16/19: sinus rhythm.  Rate 61 bpm.  Add CAC and CT chest  Echo 09/03/2020: 1. Left ventricular ejection fraction, by estimation, is 60 to 65%. The  left ventricle has normal function. The left ventricle has no regional  wall motion abnormalities. Left ventricular diastolic parameters are  consistent with Grade I diastolic  dysfunction (impaired relaxation).   2. Right ventricular systolic function is normal. The right ventricular  size is normal. There is normal pulmonary artery systolic pressure. The  estimated right ventricular systolic pressure is 23.5 mmHg.   3. The mitral valve is  normal in structure. Trivial mitral valve  regurgitation. No evidence of mitral stenosis.   4. Tricuspid valve regurgitation is mild to moderate.   5. The aortic valve is tricuspid. Aortic valve regurgitation is trivial.  No aortic stenosis is present.   6. The inferior vena cava is normal in size with greater than 50%  respiratory variability, suggesting right atrial pressure of 3 mmHg.   Comparison(s): No significant change from prior study. No evidence of  vavular vegetations or thrombi. Degree of TR is consistent with prior with  no new valvular lesions noted.   CTA URE 08/26/2020: IMPRESSION: No evidence of arterial injury in the right upper extremity.   6 mm right upper lobe pulmonary nodule  Echo 07/28/19: 1. Left ventricular ejection fraction, by estimation, is 60 to 65%. The  left ventricle has normal function. The left ventricle has no regional  wall motion abnormalities. Left ventricular diastolic parameters were  normal. GLS -24.1%.   2. Right ventricular systolic function is normal. The right ventricular  size is normal. There is mildly elevated pulmonary artery systolic  pressure. The estimated right ventricular systolic pressure is 57.3 mmHg.   3. Atrial septal aneurysm noted.   4. The mitral valve is normal in structure, there does not appear to be  prolapse. Mild mitral valve regurgitation. No evidence of mitral stenosis.   5. Tricuspid valve regurgitation is mild to moderate.   6. The aortic valve is tricuspid. Aortic valve regurgitation is trivial.  No aortic stenosis is present.   7. The inferior vena cava is dilated in size with >50% respiratory  variability, suggesting right atrial pressure of 8 mmHg.   14 Day Event Monitor 07/2019:   Quality: Fair.  Baseline artifact. Predominant rhythm: sinus rhythm Average heart rate: 77 bpm Max heart rate: 179 bpm Min heart rate: 47 bpm Pauses >2.5 seconds: none    No arrhythmias noted. Daytime bradycardia.  No  symptoms reported.  Recent Labs: No results found for requested labs within last 365 days.    Lipid Panel No results found for: "CHOL", "TRIG", "HDL", "CHOLHDL", "VLDL", "LDLCALC", "LDLDIRECT"    Wt Readings from Last 3 Encounters:  09/15/20 154 lb 6.4 oz (70 kg)  08/26/20  152 lb 3.2 oz (69 kg)  09/01/19 143 lb (64.9 kg)      ASSESSMENT AND PLAN:  No problem-specific Assessment & Plan notes found for this encounter.    Current medicines are reviewed at length with the patient today.  The patient does not have concerns regarding medicines.  The following changes have been made:  no change  Labs/ tests ordered today include:   No orders of the defined types were placed in this encounter.   Disposition:   FU with Tiffany C. Oval Linsey, MD, [facility] in ***1 year.   I,Jada L Brogden,acting as a scribe for Skeet Latch, MD.,have documented all relevant documentation on the behalf of Skeet Latch, MD,as directed by  Skeet Latch, MD while in the presence of Skeet Latch, MD.   I, [place] Oval Linsey, MD have reviewed all documentation for this visit.  The documentation of the exam, diagnosis, procedures, and orders on 03/16/2022 are all accurate and complete.   Signed, Tiffany C. Oval Linsey, MD, [facility]  03/16/2022 10:36 AM    [place]

## 2022-03-23 ENCOUNTER — Ambulatory Visit (HOSPITAL_BASED_OUTPATIENT_CLINIC_OR_DEPARTMENT_OTHER): Payer: BC Managed Care – PPO | Admitting: Cardiovascular Disease

## 2022-08-04 ENCOUNTER — Telehealth (HOSPITAL_BASED_OUTPATIENT_CLINIC_OR_DEPARTMENT_OTHER): Payer: Self-pay | Admitting: Obstetrics & Gynecology

## 2022-08-04 DIAGNOSIS — B3731 Acute candidiasis of vulva and vagina: Secondary | ICD-10-CM

## 2022-08-04 MED ORDER — FLUCONAZOLE 150 MG PO TABS: 150.0000 mg | ORAL_TABLET | Freq: Once | ORAL | 1 refills | Status: AC

## 2022-08-04 NOTE — Telephone Encounter (Signed)
Pt called on call provider requesting rx for diflucan.  Feels she has a yeast infection and treatment with diflucan is what usually fixes this issues.  Called office and was advised to speak with on call provider.  Rx for difucan 150mg  po x 1, repeat 72 hours sent to pharmacy on file.

## 2022-09-07 ENCOUNTER — Other Ambulatory Visit (HOSPITAL_BASED_OUTPATIENT_CLINIC_OR_DEPARTMENT_OTHER): Payer: Self-pay

## 2022-09-07 MED ORDER — ZEPBOUND 5 MG/0.5ML ~~LOC~~ SOAJ
5.0000 mg | SUBCUTANEOUS | 5 refills | Status: DC
  Filled 2022-09-29 – 2022-10-13 (×2): qty 2, 28d supply, fill #0
  Filled 2022-11-07: qty 2, 28d supply, fill #1

## 2022-09-07 MED ORDER — ZEPBOUND 2.5 MG/0.5ML ~~LOC~~ SOAJ
2.5000 mg | SUBCUTANEOUS | 0 refills | Status: DC
  Filled 2022-09-07: qty 2, 28d supply, fill #0

## 2022-09-16 ENCOUNTER — Other Ambulatory Visit (HOSPITAL_BASED_OUTPATIENT_CLINIC_OR_DEPARTMENT_OTHER): Payer: Self-pay

## 2022-09-29 ENCOUNTER — Other Ambulatory Visit (HOSPITAL_BASED_OUTPATIENT_CLINIC_OR_DEPARTMENT_OTHER): Payer: Self-pay

## 2022-10-09 ENCOUNTER — Other Ambulatory Visit (HOSPITAL_BASED_OUTPATIENT_CLINIC_OR_DEPARTMENT_OTHER): Payer: Self-pay

## 2022-10-13 ENCOUNTER — Other Ambulatory Visit (HOSPITAL_BASED_OUTPATIENT_CLINIC_OR_DEPARTMENT_OTHER): Payer: Self-pay

## 2022-11-07 ENCOUNTER — Other Ambulatory Visit (HOSPITAL_BASED_OUTPATIENT_CLINIC_OR_DEPARTMENT_OTHER): Payer: Self-pay

## 2022-11-07 MED ORDER — ZEPBOUND 7.5 MG/0.5ML ~~LOC~~ SOAJ
7.5000 mg | SUBCUTANEOUS | 4 refills | Status: DC
  Filled 2022-11-07: qty 2, 28d supply, fill #0
  Filled 2022-12-05: qty 2, 28d supply, fill #1

## 2022-12-04 ENCOUNTER — Other Ambulatory Visit (HOSPITAL_BASED_OUTPATIENT_CLINIC_OR_DEPARTMENT_OTHER): Payer: Self-pay

## 2022-12-04 MED ORDER — ZEPBOUND 10 MG/0.5ML ~~LOC~~ SOAJ
10.0000 mg | SUBCUTANEOUS | 4 refills | Status: DC
  Filled 2022-12-04 – 2022-12-05 (×3): qty 2, 28d supply, fill #0
  Filled 2022-12-30: qty 2, 28d supply, fill #1

## 2022-12-05 ENCOUNTER — Other Ambulatory Visit (HOSPITAL_BASED_OUTPATIENT_CLINIC_OR_DEPARTMENT_OTHER): Payer: Self-pay

## 2022-12-07 ENCOUNTER — Other Ambulatory Visit (HOSPITAL_BASED_OUTPATIENT_CLINIC_OR_DEPARTMENT_OTHER): Payer: Self-pay

## 2022-12-31 ENCOUNTER — Other Ambulatory Visit (HOSPITAL_BASED_OUTPATIENT_CLINIC_OR_DEPARTMENT_OTHER): Payer: Self-pay

## 2023-01-01 ENCOUNTER — Other Ambulatory Visit (HOSPITAL_BASED_OUTPATIENT_CLINIC_OR_DEPARTMENT_OTHER): Payer: Self-pay

## 2023-01-01 MED ORDER — ZEPBOUND 12.5 MG/0.5ML ~~LOC~~ SOAJ
12.5000 mg | SUBCUTANEOUS | 3 refills | Status: DC
  Filled 2023-01-01: qty 2, 28d supply, fill #0
  Filled 2023-01-28: qty 2, 28d supply, fill #1
  Filled 2023-02-19 – 2023-02-24 (×2): qty 2, 28d supply, fill #2
  Filled 2023-03-17 – 2023-03-19 (×2): qty 2, 28d supply, fill #3

## 2023-01-29 ENCOUNTER — Other Ambulatory Visit (HOSPITAL_BASED_OUTPATIENT_CLINIC_OR_DEPARTMENT_OTHER): Payer: Self-pay

## 2023-01-31 ENCOUNTER — Other Ambulatory Visit (HOSPITAL_BASED_OUTPATIENT_CLINIC_OR_DEPARTMENT_OTHER): Payer: Self-pay

## 2023-02-19 ENCOUNTER — Other Ambulatory Visit (HOSPITAL_BASED_OUTPATIENT_CLINIC_OR_DEPARTMENT_OTHER): Payer: Self-pay

## 2023-02-26 ENCOUNTER — Other Ambulatory Visit (HOSPITAL_BASED_OUTPATIENT_CLINIC_OR_DEPARTMENT_OTHER): Payer: Self-pay

## 2023-03-17 ENCOUNTER — Other Ambulatory Visit (HOSPITAL_BASED_OUTPATIENT_CLINIC_OR_DEPARTMENT_OTHER): Payer: Self-pay

## 2023-03-19 ENCOUNTER — Other Ambulatory Visit (HOSPITAL_BASED_OUTPATIENT_CLINIC_OR_DEPARTMENT_OTHER): Payer: Self-pay

## 2023-03-20 ENCOUNTER — Other Ambulatory Visit (HOSPITAL_BASED_OUTPATIENT_CLINIC_OR_DEPARTMENT_OTHER): Payer: Self-pay

## 2023-04-09 ENCOUNTER — Other Ambulatory Visit: Payer: Self-pay | Admitting: Obstetrics and Gynecology

## 2023-04-09 DIAGNOSIS — Z1231 Encounter for screening mammogram for malignant neoplasm of breast: Secondary | ICD-10-CM

## 2023-04-15 ENCOUNTER — Other Ambulatory Visit (HOSPITAL_BASED_OUTPATIENT_CLINIC_OR_DEPARTMENT_OTHER): Payer: Self-pay

## 2023-04-16 ENCOUNTER — Other Ambulatory Visit (HOSPITAL_BASED_OUTPATIENT_CLINIC_OR_DEPARTMENT_OTHER): Payer: Self-pay

## 2023-04-16 MED ORDER — ZEPBOUND 12.5 MG/0.5ML ~~LOC~~ SOAJ
12.5000 mg | SUBCUTANEOUS | 3 refills | Status: DC
  Filled 2023-04-16: qty 2, 28d supply, fill #0
  Filled 2023-05-23: qty 2, 28d supply, fill #1
  Filled 2023-06-18: qty 2, 28d supply, fill #2

## 2023-04-26 ENCOUNTER — Ambulatory Visit (HOSPITAL_BASED_OUTPATIENT_CLINIC_OR_DEPARTMENT_OTHER): Payer: Self-pay | Admitting: Cardiovascular Disease

## 2023-05-14 ENCOUNTER — Ambulatory Visit (HOSPITAL_BASED_OUTPATIENT_CLINIC_OR_DEPARTMENT_OTHER): Payer: Self-pay | Admitting: Cardiovascular Disease

## 2023-05-15 ENCOUNTER — Ambulatory Visit
Admission: RE | Admit: 2023-05-15 | Discharge: 2023-05-15 | Disposition: A | Discharge status: Home / Self Care | Payer: No Typology Code available for payment source | Source: Ambulatory Visit | Attending: Obstetrics and Gynecology | Admitting: Obstetrics and Gynecology

## 2023-05-15 DIAGNOSIS — Z1231 Encounter for screening mammogram for malignant neoplasm of breast: Secondary | ICD-10-CM

## 2023-05-23 ENCOUNTER — Other Ambulatory Visit (HOSPITAL_COMMUNITY): Payer: Self-pay

## 2023-05-23 ENCOUNTER — Other Ambulatory Visit (HOSPITAL_BASED_OUTPATIENT_CLINIC_OR_DEPARTMENT_OTHER): Payer: Self-pay

## 2023-05-23 ENCOUNTER — Other Ambulatory Visit: Payer: Self-pay

## 2023-06-18 ENCOUNTER — Ambulatory Visit (HOSPITAL_BASED_OUTPATIENT_CLINIC_OR_DEPARTMENT_OTHER): Payer: Self-pay | Admitting: Cardiovascular Disease

## 2023-06-18 ENCOUNTER — Other Ambulatory Visit (HOSPITAL_BASED_OUTPATIENT_CLINIC_OR_DEPARTMENT_OTHER): Payer: Self-pay

## 2023-06-18 ENCOUNTER — Encounter (HOSPITAL_BASED_OUTPATIENT_CLINIC_OR_DEPARTMENT_OTHER): Payer: Self-pay | Admitting: Cardiovascular Disease

## 2023-06-18 VITALS — BP 108/88 | HR 76 | Ht 66.0 in | Wt 119.9 lb

## 2023-06-18 DIAGNOSIS — F419 Anxiety disorder, unspecified: Secondary | ICD-10-CM

## 2023-06-18 DIAGNOSIS — R002 Palpitations: Secondary | ICD-10-CM | POA: Diagnosis not present

## 2023-06-18 DIAGNOSIS — I951 Orthostatic hypotension: Secondary | ICD-10-CM

## 2023-06-18 HISTORY — DX: Anxiety disorder, unspecified: F41.9

## 2023-06-18 MED ORDER — ZEPBOUND 12.5 MG/0.5ML ~~LOC~~ SOAJ
12.5000 mg | SUBCUTANEOUS | 3 refills | Status: AC
  Filled 2023-06-18: qty 2, 28d supply, fill #0
  Filled 2023-07-24: qty 2, 28d supply, fill #1
  Filled 2023-10-19 – 2023-11-17 (×4): qty 2, 28d supply, fill #2

## 2023-06-18 MED ORDER — METOPROLOL TARTRATE 25 MG PO TABS: 25.0000 mg | ORAL_TABLET | Freq: Two times a day (BID) | ORAL | 1 refills | Status: DC

## 2023-06-18 MED ORDER — ZEPBOUND 12.5 MG/0.5ML ~~LOC~~ SOAJ
12.5000 mg | SUBCUTANEOUS | 3 refills | Status: DC
  Filled 2023-06-18 – 2023-09-12 (×2): qty 2, 28d supply, fill #0

## 2023-06-18 NOTE — Patient Instructions (Signed)
 Medication Instructions:  STOP ATENOLOL   START METOPROLOL 25 MG TWICE A DAY   *If you need a refill on your cardiac medications before your next appointment, please call your pharmacy*  Lab Work: NONE  Testing/Procedures: NONE  Follow-Up: At [facility], you and your health needs are our priority.  As part of our continuing mission to provide you with exceptional heart care, we have created designated Provider Care Teams.  These Care Teams include your primary Cardiologist (physician) and Advanced Practice Providers (APPs -  Physician Assistants and Nurse Practitioners) who all work together to provide you with the care you need, when you need it.  We recommend signing up for the patient portal called "MyChart".  Sign up information is provided on this After Visit Summary.  MyChart is used to connect with patients for Virtual Visits (Telemedicine).  Patients are able to view lab/test results, encounter notes, upcoming appointments, etc.  Non-urgent messages can be sent to your provider as well.   To learn more about what you can do with MyChart, go to [email or web address].    Your next appointment:   3 month(s)  Provider:   Chilton Si, MD, Eligha Bridegroom, NP, or Gillian Shields, NP

## 2023-06-18 NOTE — Addendum Note (Signed)
 Addended by: Regis Bill B on: 06/18/2023 12:43 PM   Modules accepted: Orders

## 2023-06-18 NOTE — Progress Notes (Signed)
 Cardiology Office Note:  .   Date:  06/18/2023  ID:  Sharon Kramer, DOB February 02, 1956, MRN [id number] PCP: Cleatis Polka., MD  [facility] Providers Cardiologist:  Sharon Si, MD    History of Present Illness: .   Sharon Kramer is a 68 y.o. female with mild mitral valve prolapse, hyperlipidemia and SLE  who presents for follow up.  Sharon Kramer last saw Dr. Herbie Kramer 03/2019.  At that time she reported hand swelling.  Her palpitations were well-controlled at the time.  Echo in 2014 revealed LVEF 60-65% with mild LVH and normal diastolic function.  There was mild bileaflet late MVP and mild MR.  A coronary calcium score was ordered to determine her lipid goal but has not been completed.  On 07/02/19 she exercised and then two hours later she noted tightness in her chest.  She was concerned that she may have COVID and was unable to taste orange juice.  She subsequently tested negative.   Sharon Kramer has a history of mild MVP and has been on atenolol for over 20 years.  She has palpitations that are well-controlled.  At one point it was discontinued due to bradycardia and then she resumed it.  Ms. Sharon Kramer reported some atypical chest pain.  She had no exertional symptoms.  She had an echocardiogram 07/2019 that revealed LVEF 60 to 65%, no evidence of mitral valve prolapse, mild mild regurgitation otherwise was unremarkable.  She wore a 14-day event monitor that showed no arrhythmias.  She did have some daytime bradycardia to the 40s, but did not press her button to report symptoms during these times.  She had a coronary calcium score which was 0 and there were no other abnormalities. She saw Dr. Amanda Kramer on 6/1 due to pain and swelling in her right finger. He was concerned about a vascular event, and started her on steroids and aspirin. She was asked to follow up on cardiology.    On exam it did not seem consistent with an embolic event. She had an echo that was 08/2020 with LVEF 60-65% and grade 1  diastolic dysfunction but was otherwise unremarkable. She had a CT-A of the right UE that showed a pulmonary nodule but otherwise was unremarkable. ESR was within normal limits.  Since her last appointment she has been doing well physically.  However recently she has noted an increase in her palpitations.  She notes that she has been under a lot of stress.  Her brother-in-law and mother-in-law both died on the same day over the holidays.  It was a very difficult holiday season for her family.  She is also been very stressed about the current state of the world and politics.  She reports palpitations that occur several days a week.  It lasts for several minutes at a time.  There is no associated chest pain or shortness of breath.  She exercises regularly.  She does CrossFit and yoga.  She has tried deep breathing techniques that they do not seem to help.  She has a therapist which is also not been particularly helpful.  She has been on Trintellix and takes Klonopin as needed when she gets very anxious.  She does that she has been on other medications as directed by a psychiatrist in the past but they all made her feel groggy and not like herself, though they did make her feel more calm.  ROS:  As per HPI  Studies Reviewed: Marland Kitchen   EKG Interpretation Date/Time:  Monday June 18 2023 12:00:18 EDT Ventricular Rate:  83 PR Interval:  176 QRS Duration:  72 QT Interval:  356 QTC Calculation: 418 R Axis:   70  Text Interpretation: Normal sinus rhythm Normal ECG When compared with ECG of 08-Oct-1998 08:03, No significant change was found Confirmed by Sharon Kramer ([id number]) on 06/18/2023 12:36:53 PM Echo 08/2020:   1. Left ventricular ejection fraction, by estimation, is 60 to 65%. The  left ventricle has normal function. The left ventricle has no regional  wall motion abnormalities. Left ventricular diastolic parameters are  consistent with Grade I diastolic  dysfunction (impaired relaxation).   2. Right  ventricular systolic function is normal. The right ventricular  size is normal. There is normal pulmonary artery systolic pressure. The  estimated right ventricular systolic pressure is 26.0 mmHg.   3. The mitral valve is normal in structure. Trivial mitral valve  regurgitation. No evidence of mitral stenosis.   4. Tricuspid valve regurgitation is mild to moderate.   5. The aortic valve is tricuspid. Aortic valve regurgitation is trivial.  No aortic stenosis is present.   6. The inferior vena cava is normal in size with greater than 50%  respiratory variability, suggesting right atrial pressure of 3 mmHg.     Risk Assessment/Calculations:             Physical Exam:   VS:  BP 108/88   Pulse 76   Ht 5\' 6"  (1.676 m)   Wt 119 lb 14.4 oz (54.4 kg)   SpO2 99%   BMI 19.35 kg/m  , BMI Body mass index is 19.35 kg/m. GENERAL:  Well appearing HEENT: Pupils equal round and reactive, fundi not visualized, oral mucosa unremarkable NECK:  No jugular venous distention, waveform within normal limits, carotid upstroke brisk and symmetric, no bruits, no thyromegaly LUNGS:  Clear to auscultation bilaterally HEART:  RRR.  PMI not displaced or sustained,S1 and S2 within normal limits, no S3, no S4, no clicks, no rubs, no murmurs ABD:  Flat, positive bowel sounds normal in frequency in pitch, no bruits, no rebound, no guarding, no midline pulsatile mass, no hepatomegaly, no splenomegaly EXT:  2 plus pulses throughout, no edema, no cyanosis no clubbing SKIN:  No rashes no nodules NEURO:  Cranial nerves II through XII grossly intact, motor grossly intact throughout PSYCH:  Cognitively intact, oriented to person place and time   ASSESSMENT AND PLAN: .    # Palpitations:  Sharon Kramer has noted increased palpitations lately.  This seems to be very associated with anxiety and mood.  She has no exertional symptoms.  She is on atenolol which typically works well but has not been working as well lately.  Blood  pressure is low.  Therefore I do not feel comfortable titrating her atenolol.  She is currently taking 25 mg daily.  Recommend transition metoprolol 25 mg twice daily to see if that allows more blood pressure room and improve control of her palpitations.  We also did discuss the fact that her palpitations are more a symptom and rather than the direct etiology of her problem.  I suspect this is more related to her anxiety and stress.  No arrhythmias were noted on her monitor in the past.  She is already on medications and I encouraged her to talk with her primary care provider or psychiatrist about any alternative therapies that may be available.  We also discussed box breathing techniques.  Encouraged to continue with her meditation and yoga and exercise,  as these have all been helpful for her in the past as well.    # Mitral regurgitation: # Mild-moderate TR:  Patient previously had reports of mitral valve prolapse and mild regurgitation.  Her echo 08/2020 revealed a normal mitral valve with only trivial regurgitation.  She exercises regularly and has no exertional symptoms.  She has no edema and otherwise no cardiac symptoms.  No repeat echocardiogram needed at this time.         Dispo: f/u in 2 months  Signed, Sharon Si, MD

## 2023-06-19 ENCOUNTER — Other Ambulatory Visit (HOSPITAL_BASED_OUTPATIENT_CLINIC_OR_DEPARTMENT_OTHER): Payer: Self-pay

## 2023-07-25 ENCOUNTER — Other Ambulatory Visit (HOSPITAL_BASED_OUTPATIENT_CLINIC_OR_DEPARTMENT_OTHER): Payer: Self-pay

## 2023-07-30 ENCOUNTER — Ambulatory Visit (HOSPITAL_BASED_OUTPATIENT_CLINIC_OR_DEPARTMENT_OTHER): Payer: Self-pay | Admitting: Cardiovascular Disease

## 2023-09-12 ENCOUNTER — Other Ambulatory Visit (HOSPITAL_BASED_OUTPATIENT_CLINIC_OR_DEPARTMENT_OTHER): Payer: Self-pay

## 2023-10-18 ENCOUNTER — Encounter: Payer: Self-pay | Admitting: *Deleted

## 2023-10-19 ENCOUNTER — Encounter (HOSPITAL_BASED_OUTPATIENT_CLINIC_OR_DEPARTMENT_OTHER): Payer: Self-pay | Admitting: Cardiovascular Disease

## 2023-10-19 ENCOUNTER — Ambulatory Visit (HOSPITAL_BASED_OUTPATIENT_CLINIC_OR_DEPARTMENT_OTHER): Admitting: Cardiovascular Disease

## 2023-10-19 ENCOUNTER — Other Ambulatory Visit (HOSPITAL_BASED_OUTPATIENT_CLINIC_OR_DEPARTMENT_OTHER): Payer: Self-pay

## 2023-10-19 VITALS — BP 100/64 | Ht 65.0 in | Wt 121.6 lb

## 2023-10-19 DIAGNOSIS — I951 Orthostatic hypotension: Secondary | ICD-10-CM

## 2023-10-19 DIAGNOSIS — R0789 Other chest pain: Secondary | ICD-10-CM | POA: Diagnosis not present

## 2023-10-19 NOTE — Patient Instructions (Signed)
 Medication Instructions:  Your physician recommends that you continue on your current medications as directed. Please refer to the Current Medication list given to you today.  *If you need a refill on your cardiac medications before your next appointment, please call your pharmacy*  Lab Work: None ordered.  You may go to any [facility] Location for your lab work:  [facility] - [address] ([facility]) - [address] [phone] [address]  [place] - [address]   [place]  - [address]   [place] - [address] - [address] [employer]  - [address] - [address] ([facility]   If you have labs (blood work) drawn today and your tests are completely normal, you will receive your results only by: [employer] (if you have MyChart)  If you have any lab test that is abnormal or we need to change your treatment, we will call you or send a MyChart message to review the results.  Testing/Procedures: None ordered.  Follow-Up: At [facility], you and your health needs are our priority.  As part of our continuing mission to provide you with exceptional heart care, we have created designated Provider Care Teams.  These Care Teams include your primary Cardiologist (physician) and Advanced Practice Providers (APPs -  Physician Assistants and Nurse Practitioners) who all work together to provide you with the care you need, when you need it.  We recommend signing up for the patient portal called MyChart.  Sign up information is provided on this After Visit Summary.  MyChart is used to connect with patients for Virtual Visits (Telemedicine).  Patients are able to view lab/test results, encounter notes, upcoming appointments, etc.  Non-urgent messages can be sent to your provider as well.   To learn more about what you can do with MyChart, go to  [email or web address].    Your next appointment:   1 year(s) with CT before  The format for your next appointment:   In Person  Provider:   Annabella Scarce, MD

## 2023-10-19 NOTE — Progress Notes (Signed)
 Cardiology Office Note:  .   Date:  10/19/2023  ID:  Sharon Kramer, DOB [birth year], MRN [id number] PCP: Loreli Elsie JONETTA Mickey., MD  [facility] Providers Cardiologist:  Annabella Scarce, MD    History of Present Illness: .    Sharon Kramer is a 68 y.o. female with mild mitral valve prolapse, hyperlipidemia and SLE  who presents for follow up.  Sharon Kramer last saw Dr. Anner 03/2019.  At that time she reported hand swelling.  Her palpitations were well-controlled at the time.  Echo in 2014 revealed LVEF 60-65% with mild LVH and normal diastolic function.  There was mild bileaflet late MVP and mild MR.  A coronary calcium score was ordered to determine her lipid goal but has not been completed.  On 07/02/19 she exercised and then two hours later she noted tightness in her chest.     Sharon Kramer has a history of mild MVP and has been on atenolol  for over 20 years.  She has palpitations that are well-controlled.  At one point it was discontinued due to bradycardia and then she resumed it.  Sharon Kramer reported some atypical chest pain.  She had no exertional symptoms.  She had an echocardiogram 07/2019 that revealed LVEF 60 to 65%, no evidence of mitral valve prolapse, mild mild regurgitation otherwise was unremarkable.  She wore a 14-day event monitor that showed no arrhythmias.  She did have some daytime bradycardia to the 40s, but did not press her button to report symptoms during these times.  She had a coronary calcium score which was 0 and there were no other abnormalities. She saw Dr. Camella on 6/1 due to pain and swelling in her right finger. He was concerned about a vascular event, and started her on steroids and aspirin. She was asked to follow up on cardiology.    On exam it did not seem consistent with an embolic event. She had an echo that was 08/2020 with LVEF 60-65% and grade 1 diastolic dysfunction but was otherwise unremarkable. She had a CT-A of the right UE that showed a pulmonary  nodule but otherwise was unremarkable. ESR was within normal limits.  At her appointment 05/2023 she was doing well from a cardiac standpoint but struggling with some anxiety.  This was causing an increase in her palpitations.  She was transitioned from atenolol  to metoprolol .   Since her last appointment Sharon Kramer has been doing well.  She has not had any palpitations and feels much better on metoprolol  than atenolol .  Even when she is stressed her palpitations are stable.  She continues to exercise regularly.  She is no longer on Mounjaro  because she lost too much weight.  She has no exertional chest pain or shortness of breath.  She denies lower edema, orthopnea, or PND.  Today she is sad because she just found out that her nephew died in a car accident 4 AM.  ROS:  As per HPI  Studies Reviewed: .       Echo 09/03/2020:  1. Left ventricular ejection fraction, by estimation, is 60 to 65%. The  left ventricle has normal function. The left ventricle has no regional  wall motion abnormalities. Left ventricular diastolic parameters are  consistent with Grade I diastolic  dysfunction (impaired relaxation).   2. Right ventricular systolic function is normal. The right ventricular  size is normal. There is normal pulmonary artery systolic pressure. The  estimated right ventricular systolic pressure is 26.0 mmHg.  3. The mitral valve is normal in structure. Trivial mitral valve  regurgitation. No evidence of mitral stenosis.   4. Tricuspid valve regurgitation is mild to moderate.   5. The aortic valve is tricuspid. Aortic valve regurgitation is trivial.  No aortic stenosis is present.   6. The inferior vena cava is normal in size with greater than 50%  respiratory variability, suggesting right atrial pressure of 3 mmHg.   Risk Assessment/Calculations:             Physical Exam:   VS:  BP 100/64   Ht 5' 5 (1.651 m)   Wt 121 lb 9.6 oz (55.2 kg)   BMI 20.24 kg/m  , BMI Body mass index is  20.24 kg/m. GENERAL:  Well appearing HEENT: Pupils equal round and reactive, fundi not visualized, oral mucosa unremarkable NECK:  No jugular venous distention, waveform within normal limits, carotid upstroke brisk and symmetric, no bruits, no thyromegaly LUNGS:  Clear to auscultation bilaterally HEART:  RRR.  PMI not displaced or sustained,S1 and S2 within normal limits, no S3, no S4, no clicks, no rubs, no murmurs ABD:  Flat, positive bowel sounds normal in frequency in pitch, no bruits, no rebound, no guarding, no midline pulsatile mass, no hepatomegaly, no splenomegaly EXT:  2 plus pulses throughout, no edema, no cyanosis no clubbing SKIN:  No rashes no nodules NEURO:  Cranial nerves II through XII grossly intact, motor grossly intact throughout PSYCH:  Cognitively intact, oriented to person place and time   ASSESSMENT AND PLAN: .    # Palpitations: Stable on metoprolol .  # Hyperlipidemia: LDL 166.  Her calcium score is 0 and she has no other risk factors.  Therefore avoiding cholesterol medication at this time.  Will repeat a calcium score prior to her next appointment.  She started taking aspirin on her own.  We discussed her overall low cardiovascular risk and she will stop taking this.       Dispo: f/u 1 year  Signed, Annabella Scarce, MD

## 2023-10-22 ENCOUNTER — Other Ambulatory Visit (HOSPITAL_BASED_OUTPATIENT_CLINIC_OR_DEPARTMENT_OTHER): Payer: Self-pay

## 2023-10-29 ENCOUNTER — Other Ambulatory Visit (HOSPITAL_BASED_OUTPATIENT_CLINIC_OR_DEPARTMENT_OTHER): Payer: Self-pay

## 2023-11-05 ENCOUNTER — Other Ambulatory Visit (HOSPITAL_BASED_OUTPATIENT_CLINIC_OR_DEPARTMENT_OTHER): Payer: Self-pay

## 2023-11-17 ENCOUNTER — Other Ambulatory Visit (HOSPITAL_BASED_OUTPATIENT_CLINIC_OR_DEPARTMENT_OTHER): Payer: Self-pay

## 2023-11-27 DIAGNOSIS — J45909 Unspecified asthma, uncomplicated: Secondary | ICD-10-CM | POA: Diagnosis not present

## 2023-11-27 DIAGNOSIS — R059 Cough, unspecified: Secondary | ICD-10-CM | POA: Diagnosis not present

## 2023-11-27 DIAGNOSIS — R509 Fever, unspecified: Secondary | ICD-10-CM | POA: Diagnosis not present

## 2023-11-27 DIAGNOSIS — Z1152 Encounter for screening for COVID-19: Secondary | ICD-10-CM | POA: Diagnosis not present

## 2023-12-01 DIAGNOSIS — Z0100 Encounter for examination of eyes and vision without abnormal findings: Secondary | ICD-10-CM | POA: Diagnosis not present

## 2023-12-08 ENCOUNTER — Other Ambulatory Visit (HOSPITAL_BASED_OUTPATIENT_CLINIC_OR_DEPARTMENT_OTHER): Payer: Self-pay | Admitting: Cardiovascular Disease

## 2024-01-10 DIAGNOSIS — D72819 Decreased white blood cell count, unspecified: Secondary | ICD-10-CM | POA: Diagnosis not present

## 2024-01-10 DIAGNOSIS — E785 Hyperlipidemia, unspecified: Secondary | ICD-10-CM | POA: Diagnosis not present

## 2024-01-10 DIAGNOSIS — E041 Nontoxic single thyroid nodule: Secondary | ICD-10-CM | POA: Diagnosis not present

## 2024-02-20 DIAGNOSIS — M79645 Pain in left finger(s): Secondary | ICD-10-CM | POA: Diagnosis not present

## 2024-02-20 DIAGNOSIS — S62637A Displaced fracture of distal phalanx of left little finger, initial encounter for closed fracture: Secondary | ICD-10-CM | POA: Diagnosis not present

## 2024-02-20 DIAGNOSIS — M19049 Primary osteoarthritis, unspecified hand: Secondary | ICD-10-CM | POA: Diagnosis not present

## 2024-03-12 DIAGNOSIS — M1712 Unilateral primary osteoarthritis, left knee: Secondary | ICD-10-CM | POA: Diagnosis not present

## 2024-03-31 ENCOUNTER — Other Ambulatory Visit: Payer: Self-pay | Admitting: Obstetrics and Gynecology

## 2024-03-31 DIAGNOSIS — Z1231 Encounter for screening mammogram for malignant neoplasm of breast: Secondary | ICD-10-CM

## 2024-04-15 ENCOUNTER — Other Ambulatory Visit (HOSPITAL_BASED_OUTPATIENT_CLINIC_OR_DEPARTMENT_OTHER): Payer: Self-pay

## 2024-04-15 MED ORDER — ZEPBOUND 12.5 MG/0.5ML ~~LOC~~ SOAJ
12.5000 mg | SUBCUTANEOUS | 3 refills | Status: AC
  Filled 2024-04-15: qty 2, 28d supply, fill #0

## 2024-05-15 ENCOUNTER — Ambulatory Visit

## 2024-05-20 ENCOUNTER — Ambulatory Visit: Admitting: Dermatology

## 2024-09-22 ENCOUNTER — Other Ambulatory Visit (HOSPITAL_BASED_OUTPATIENT_CLINIC_OR_DEPARTMENT_OTHER)
